# Patient Record
Sex: Male | Born: 1985
Health system: Southern US, Community
[De-identification: ages and names within clinical notes are randomized; demographics above are authoritative.]

## PROBLEM LIST (undated history)

## (undated) DIAGNOSIS — F419 Anxiety disorder, unspecified: Secondary | ICD-10-CM

## (undated) DIAGNOSIS — A6920 Lyme disease, unspecified: Secondary | ICD-10-CM

## (undated) HISTORY — DX: Anxiety disorder, unspecified: F41.9

## (undated) HISTORY — PX: INGUINAL HERNIA REPAIR: SUR1180

## (undated) HISTORY — DX: Lyme disease, unspecified: A69.20

---
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
## 2018-03-14 DIAGNOSIS — M778 Other enthesopathies, not elsewhere classified: Secondary | ICD-10-CM | POA: Diagnosis not present

## 2018-03-14 DIAGNOSIS — M25521 Pain in right elbow: Secondary | ICD-10-CM | POA: Diagnosis not present

## 2018-03-14 DIAGNOSIS — M7711 Lateral epicondylitis, right elbow: Secondary | ICD-10-CM | POA: Diagnosis not present

## 2018-03-26 DIAGNOSIS — H5213 Myopia, bilateral: Secondary | ICD-10-CM | POA: Diagnosis not present

## 2018-03-26 DIAGNOSIS — Z83511 Family history of glaucoma: Secondary | ICD-10-CM | POA: Diagnosis not present

## 2018-03-29 DIAGNOSIS — M7711 Lateral epicondylitis, right elbow: Secondary | ICD-10-CM | POA: Diagnosis not present

## 2018-03-30 DIAGNOSIS — M7711 Lateral epicondylitis, right elbow: Secondary | ICD-10-CM | POA: Insufficient documentation

## 2018-06-27 DIAGNOSIS — D229 Melanocytic nevi, unspecified: Secondary | ICD-10-CM | POA: Diagnosis not present

## 2018-06-27 DIAGNOSIS — D1721 Benign lipomatous neoplasm of skin and subcutaneous tissue of right arm: Secondary | ICD-10-CM | POA: Diagnosis not present

## 2018-06-27 DIAGNOSIS — L814 Other melanin hyperpigmentation: Secondary | ICD-10-CM | POA: Diagnosis not present

## 2018-06-27 DIAGNOSIS — L700 Acne vulgaris: Secondary | ICD-10-CM | POA: Diagnosis not present

## 2018-09-05 ENCOUNTER — Other Ambulatory Visit: Payer: Self-pay

## 2018-09-05 ENCOUNTER — Telehealth: Payer: Self-pay

## 2018-09-05 DIAGNOSIS — Z20822 Contact with and (suspected) exposure to covid-19: Secondary | ICD-10-CM

## 2018-09-05 NOTE — Telephone Encounter (Signed)
Patient called after message reviewed.  Wife of patient an RN with [employer], sx'ic this morning and getting tested for Covid. He is asx'ic, a Automotive engineer. Out today and next scheduled for Sunday. Currently needs to quarantine presently.  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   If his wife test returns + or his test returns + the health department will be notified and helpful in further contact tracing and potential return to work.  If his wife's test is negative and she is getting better, and his test returns negative and he remains asx'ic, likely can return to work, although Sunday may be a little early to have all the data points back for this return noted (as is Thursday 10:20 am presently). He understood.  He noted he has been working with a friend recently and should his friend go get tested. I recommended waiting on his friend being tested, and that friend may need to pending results of his wife's and his testing.   I noted to him he could call the health department if he preferred, and he agreed the above was best and will proceed with getting tested presently (at the [facility] drive thru facility). They will definitely need to be notified if any positives arise and he understood.

## 2018-09-05 NOTE — Telephone Encounter (Signed)
In the last 24 hours have you experienced any of these symptoms . Difficulty breathing - no . Nasal congestion - no  . New cough - no . Runny nose - no . Shortness of breath - yes trouble catching breath last couple days . New sore throat - no . Unexplained body aches - no . Nausea or vomiting - no . Diarrhea - no . Loss of taste or smell - no . Fever (temp>100 F/37.8 C) or chills - no  Exposure:  . Have you been in close contact with someone with confirmed diagnosis of COVID or person under going testing in past 14 days? Yes wife going today   . Have you been tested for COVID? If yes date, location, results in known no  . Have you been living in the same home as a person with confirmed diagnosis of COVID or a person undergoing testing? (household contact) yes wife being tested today   Have you been diagnosed with COVID or are you waiting on test results? Yes wife today  . Have you traveled anywhere in the past 4 weeks? If yes where - 08/03/18 went to [place] At home just pt and wife Off until Sunday  Wife started chills, nausea, fever, upset stomach started this morning. She went for testing this morning, she is a Therapist, sports at [employer]

## 2018-09-07 ENCOUNTER — Encounter: Payer: Self-pay | Admitting: Internal Medicine

## 2018-09-07 LAB — SPECIMEN STATUS REPORT

## 2018-09-07 LAB — NOVEL CORONAVIRUS, NAA: SARS-CoV-2, NAA: NOT DETECTED

## 2018-09-09 ENCOUNTER — Other Ambulatory Visit: Payer: Self-pay | Admitting: Internal Medicine

## 2018-09-09 MED ORDER — ESCITALOPRAM OXALATE 10 MG PO TABS: 10.0000 mg | ORAL_TABLET | Freq: Every day | ORAL | 0 refills | Status: DC

## 2018-09-09 NOTE — Telephone Encounter (Signed)
Pt states test came back neg. He wasn't feeling good over weekend, nausea and tired but feels fine now. Wife has been released to go back to work since test was neg. Not taking any meds. Not scheduled to go back to work until Wednesday.

## 2018-09-09 NOTE — Addendum Note (Signed)
Addended by: Virgilio Frees on: 09/09/2018 08:08 AM   Modules accepted: Orders

## 2018-09-09 NOTE — Telephone Encounter (Addendum)
Pt states he also needs his lexapro refilled, it is written for 1 a day but he has always took it 2 at night. He said maybe that's why he runs out so early. Said a previous MD in another state told him to take 2 at night before bed.

## 2018-09-09 NOTE — Progress Notes (Signed)
Called pt and made him aware he will come for an office visit on 09/16/18 to discuss meds and he would like to have labs drawn for lyme disease.

## 2018-09-09 NOTE — Progress Notes (Signed)
Patient called this morning and requested refill of his Lexapro. He noted he is now taking two tablets daily as was rec'ed to do so by an outside provider. The last refill through here was for one daily.   I also sent his Covid result through MyChart early Saturday so he would have this available to him for the weekend (was negative). He noted this morning on the call with Mearl Latin that his wife's Covid test returned negative as well. He was feeling nausea and fatigue this weekend, but feels much better today. His next scheduled work shift is Wed.   I will refill his Lexapro to take one daily with #60 given and ask that he schedule a follow-up here this week or next at the latest to review (and will have plenty to use twice daily in the interim until the f/u).  Also, a note to RTW was completed, and if he gets more sx'ic again, is to f/u.

## 2018-09-10 ENCOUNTER — Telehealth: Payer: Self-pay | Admitting: Internal Medicine

## 2018-09-10 MED ORDER — VALACYCLOVIR HCL 1 G PO TABS: ORAL_TABLET | ORAL | 3 refills | Status: DC

## 2018-09-10 NOTE — Telephone Encounter (Signed)
Reviewed message: "Yesterday around noon severe nausea felt he was going to pass out, then diarrhea after. Went to bed about 7:30pm, was up all night and this am diarrhea and woke up with a cold sore. He did take zofran this morning and last night he had at home. He checked his temp and it was 99.1 but he is sitting inside the [facility] and sweating.  He is having muscle pain and chills. He is getting worse since yesterday.  He states that he has had lyme's disease in the past and worries that he may have it again.   He is supposed to go back to work tomorrow"  Wife is a Producer, television/film/video, was in ICU 3 days prior to her not feeling well, and she was tested last week and was negative. Has no sx's now and back to work yesterday.  After noting some nausea and fatigue over the weekend, he was feeling great Monday morning until about noon, then felt nausea. No vomiting (as took Zofran to prevent), pounding HA, temp 100 right now, 99.1 this am. Not take tylenol yet. + cold sore. No marked cough (thinks is seasonal allergies), + diarrhea with many BM's overnight and this am, achy. In [facility] and trying to drink fluids. No loss of taste or smell.  He noted a tick bite weeks ago on his back and had a welt develop where bit, not a target rash when asked, (and that he has pulled off many ticks over the summer and has a h/o Lyme, on doxy to treat and had to see ID, get an ECG then) and he wondered about a test for Lyme again.   Discussed next steps, and will hold off on repeating the Covid test today but noted may need again in the next couple days pending his status (as did note Covid can present with GI sx's like he notes), not feel best to do any treatment empirically for Lyme and explained why and he agreed, as the med may worsen his GI sx's and also noted the blood testing (antibody test) may not be as helpful given his history of Lyme, rash not the classic bullseye by description.  More worried about a GI'itis possibility,  more viral than food induced and rec'ed increased fluids like doing, a pepto bismal product prn to help, and tylenol rather than any ibuprofen or aleve type product for HA and feverish feelings, rest with no work tomorrow, and monitor sx's. Note for work will be completed.   Noted valtrex can help with cold sore if started early in course and will prescribe this - 2 gm every 12 hours for two doses and he will pick up to use.   Will f/u by phone again at the latest on Thursday, and he has an appt scheduled with me on Monday to review his higher dose of Lexapro has been taking and await his status with above illness to confirm keeping this planned. Can f/u sooner prn.

## 2018-09-10 NOTE — Telephone Encounter (Signed)
Yesterday around noon severe nausea felt he was going to pass out, then diarrhea after. Went to bed about 7:30pm, was up all night and this am diarrhea and woke up with a cold sore. He did take zofran this morning and last night he had at home. He checked his temp and it was 99.1 but he is sitting inside the [facility] and sweating.  He is having muscle pain and chills. He is getting worse since yesterday.  He states that he has had lyme's disease in the past and worries that he may have it again.   He is supposed to go back to work tomorrow.

## 2018-09-10 NOTE — Telephone Encounter (Signed)
°  1. Do you have a fever? 99.1 2. Are you having chills? no 3. Do you have a sore throat? no 4. Are you experiencing resp S/Sx -cough, SOB? no 5. Do you have muscle aches? yes 6. Are you experiencing N/V/D?  n/d 7. Experiencing loss of sense of taste and/or smell? no 8. Do you have a headache? yes 9. Have you had contact with a person confirmed Positive for Covid-19? no 10. Have you traveled outside of [place]? 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  His s/m stared about 12pm yesterday. He thought he was over heated. He took a shower and laid down and feel asleep till around 7pm. He still felt bad he was back and forth to the bathroom with diarrhea all night. He also has a cold sore this morning and he said that is his indicator when he's really sick.

## 2018-09-12 NOTE — Telephone Encounter (Signed)
Patient called. Message reviewed.  Joseph Ochoa feels the same, really tired, no cough, no SOB, 100 this am temp,  3 loose BM's this am, settled down some, no blood. + body aches. Less nausea.  He still thinks he may have Lyme disease again.   Feel repeating the test for Covid is our best next step and he was ok with this. And will get again at the drive through.  Discussed Lyme with him again and he was treated in past for this in the past. Seems less likely but cannot exclude presently and limited some with testing for this due to his history noted as well. And empirically adding an abx not felt best presently.  Cont sx'ic measures and await f/u Covid test Not able to return to work Sat,hope by next scheduled shift early next week if feeling better.  F/u at latest again on Monday needed

## 2018-09-12 NOTE — Telephone Encounter (Signed)
Called pt he is very tired, still has headache, diarrhea has slowed down but not stopped. Took temp while on phone with me and it is 99.2.   Next day on shift is Saturday.

## 2018-09-13 ENCOUNTER — Other Ambulatory Visit: Payer: Self-pay

## 2018-09-13 DIAGNOSIS — Z20822 Contact with and (suspected) exposure to covid-19: Secondary | ICD-10-CM

## 2018-09-16 ENCOUNTER — Other Ambulatory Visit: Payer: Self-pay

## 2018-09-16 ENCOUNTER — Ambulatory Visit: Payer: Self-pay | Admitting: Internal Medicine

## 2018-09-16 DIAGNOSIS — Z20822 Contact with and (suspected) exposure to covid-19: Secondary | ICD-10-CM

## 2018-09-16 LAB — NOVEL CORONAVIRUS, NAA

## 2018-09-16 NOTE — Telephone Encounter (Signed)
Called patient, note reviewed from contacting him this am. Covid test on Friday was not run as insufficient sample noted. He remains sx'ic.  The immodium/pepto combination helped lessen diarrhea Friday, then stopped taking Sat as felt abdominal pains and felt more constipated, and then diarrhea increased again. Liquid, no blood, no black stools, in last 24 hours has gone about 10 times,  Sunday temp was 100.4, no fever today (99.3 earlier) although taking tylenol Min cough (he thinks seasonal), NP, no SOB, + body aches, very tired, no dysuria, no mucus with PND or cough, no bad sore throats, smell/taste ok. + N, no vomiting No new contacts of concern for Covid noted No recent abx, no travel in past weeks, (except [place] July 4th weekend) Just took some immodium now, used zofran this am, tylenol as well for temps.  I do think it would be best to repeat the Covid test and he will go today to get that done (drive thru like previous). Can use the imodium and pepto again in smaller quantities prn and cont tylenol prn and stay very well hydrated and very bland with any diet.  Not able to work presently (as await test results and him getting better sx'ically). Discussed not usually get stool cultures to send unless diarrhea more chronic without a concerning history of abx use or travel, but may need over time pending his response.   Will f/u with him by phone Wed at the latest, sooner prn.

## 2018-09-16 NOTE — Telephone Encounter (Signed)
Joseph Ochoa called & said he received a MyChart alert that his COVID test from Friday (8/14) results are inconclusive & the test was canceled because not enough sample was obtained. States he's having the following symptoms: 1.  Temp of 100.4 yesterday, but none so far today.  He did take some Tylenol earlier this AM. 2.  Still having diarrhea & it's waking him up multiple times at night.  Said he stopped taking the Immodium/Pepto Bismol around noon on Saturday because it was causing severe abd pain.  Late Saturday night, diarrhea started back.  Said he's gone 6x times this morning so far (@ 10:42 am). 3.  Feels nauseate - no vomiting.  States he's been taking Zofran where he had a Rx from a previous sickness.  States he's drinking non-stop water & gatorade. 4.  "Super super tired" - sleeping a lot. 5.  Been in air conditioning, but profusely sweating. Advised him not to come at 1:15pm today & that Dr. Roxan Hockey will contact him.  Gergory questioned whether he needs to go back today for another COVID test.  AMD

## 2018-09-17 NOTE — Telephone Encounter (Signed)
Follow up on Wednesday with pt.

## 2018-09-18 LAB — NOVEL CORONAVIRUS, NAA: SARS-CoV-2, NAA: NOT DETECTED

## 2018-09-18 NOTE — Telephone Encounter (Signed)
Left message on machine.

## 2018-09-18 NOTE — Telephone Encounter (Signed)
Left message for pt to call back  °

## 2018-09-18 NOTE — Telephone Encounter (Signed)
Pt states that he feels better, still has diarrhea and is very tired. No fever and additional covid test is NEG. He is scheduled to work back again on Friday.

## 2018-09-18 NOTE — Telephone Encounter (Signed)
As per our discussion, glad is feeling somewhat better. Two tests for Covid now negative. Still notes fatigued and still some diarrhea. Had previously, stopped sx'ic meds and then more constipated feelings and then diarrhea sx's recurred. No abx prior, no travel of concern. He had Lyme prior and was concerned for this possibility, and noted testing can be challenging if had prior (antibody can remain positive, including IgM Ab), and not best to empirically prescribe an abx with his sx's noted. He noted tick exposures but not the classic rash can see with Lyme.  Feel best approach is see how feels today and tomorrow and is scheduled to return to work Friday. If sx's cont to improve, ok to RTW as feel not a Covid + concern. If not and sx's are more problematic, recommend a f/u visit to be assessed, and likely more comprehensive testing at that point.

## 2018-09-19 DIAGNOSIS — L918 Other hypertrophic disorders of the skin: Secondary | ICD-10-CM | POA: Diagnosis not present

## 2018-09-19 DIAGNOSIS — D229 Melanocytic nevi, unspecified: Secondary | ICD-10-CM | POA: Diagnosis not present

## 2018-09-19 DIAGNOSIS — L700 Acne vulgaris: Secondary | ICD-10-CM | POA: Diagnosis not present

## 2018-09-19 DIAGNOSIS — L814 Other melanin hyperpigmentation: Secondary | ICD-10-CM | POA: Diagnosis not present

## 2018-09-19 NOTE — Telephone Encounter (Signed)
Spoke with pt and he will stay out of work Friday and try to return on Monday. He did however make an appt to come in and see a provider on Tuesday since he would like to discuss his concern of Lyme Disease.

## 2018-09-20 NOTE — Telephone Encounter (Signed)
Joseph Ochoa needs a return to work notice. He goes back on Monday the 24th. It needs to go [name].

## 2018-09-20 NOTE — Telephone Encounter (Signed)
Note faxed to supervisor

## 2018-09-24 ENCOUNTER — Other Ambulatory Visit: Payer: Self-pay

## 2018-09-24 ENCOUNTER — Encounter: Payer: Self-pay | Admitting: Physician Assistant

## 2018-09-24 ENCOUNTER — Ambulatory Visit: Payer: 59 | Admitting: Physician Assistant

## 2018-09-24 VITALS — BP 126/86 | HR 69 | Temp 98.0°F | Resp 14 | Ht 69.0 in | Wt 213.0 lb

## 2018-09-24 DIAGNOSIS — Z87898 Personal history of other specified conditions: Secondary | ICD-10-CM

## 2018-09-24 NOTE — Progress Notes (Signed)
33 yo WM followed recently for concerns about possible Covid. Wife is a Marine scientist an recently reported negative after possible exposure.  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 Treated symptomatically and symptoms have now resolved. He feels generally well  except tires more easily than usual. Had a distant hx experience with Lymes disease years ago in [place] and that possibility was concerning to him. Feeling overall much better now. We discussed situational stressors with CV issues, job adjustments , family concerns as well as increase Fall allergy symptoms in the area. Also recognizes long standing issues with anxiety but denies concern at this time.    Phys Exam VS reviewed WNL Alert interactive" feels fine" Skin well hydrated Eyes clear Breathing Quiet regular Lungs clear ausc Heart RSR Abd soft, nontender, normal BS Ambulatory  A; Diarhea resolved  P: Plan to follow up if recurance,questions or concerns  Has returned to work and is doing well. Symptoms could have been a simple viral infection or other expossure  Did not have seasonal allergies in [place] , but advised can develop later in life  and in new environment-reviewed common Fall  Irritants locally.

## 2018-09-24 NOTE — Patient Instructions (Signed)
Lyme Disease Lyme disease is an infection that can affect many parts of the body, including the skin, joints, and nervous system. It is a bacterial infection that starts from the bite of an infected tick. Over time, the infection can worsen, and some of the symptoms are similar to the flu. If Lyme disease is not treated, it may cause joint pain, swelling, numbness, problems thinking, fatigue, muscle weakness, and other problems. What are the causes? This condition is caused by bacteria called Borrelia burgdorferi.  You can get Lyme disease by being bitten by an infected tick.  Only black-legged, or Ixodes, ticks that are infected with the bacteria can cause Lyme disease.  The tick must be attached to your skin for a certain period of time to pass along the infection. This is usually 36-48 hours.  Deer often carry infected ticks. What increases the risk? The following factors may make you more likely to develop this condition:  Living in or visiting these areas in the [place].: ? [place]. ? The mid[place] states. ? The [place].  Spending time in wooded or grassy areas.  Being outdoors with exposed skin.  Camping, gardening, hiking, fishing, hunting, or working outdoors.  Failing to remove a tick from your skin. What are the signs or symptoms? Symptoms of this condition may include:  Chills and fever.  Headache.  Fatigue.  General achiness.  Muscle pain.  Joint pain, often in the knees.  A round, red rash that surrounds the center of the tick bite. The center of the rash may be blood colored or have tiny blisters.  Swollen lymph glands.  Stiff neck. How is this diagnosed? This condition is diagnosed based on:  Your symptoms and medical history.  A physical exam.  A blood test. How is this treated? The main treatment for this condition is antibiotic medicine, which is usually taken by mouth (orally).  The length of treatment depends on how soon after a  tick bite you begin taking the medicine. In some cases, treatment is necessary for several weeks.  If the infection is severe, antibiotics may need to be given through an IV that is inserted into one of your veins. Follow these instructions at home:  Take over-the-counter and prescription medicines only as told by your health care provider. Finish all antibiotic medicine, even when you start to feel better.  Ask your health care provider about taking a probiotic in between doses of your antibiotic to help avoid an upset stomach or diarrhea.  Check with your health care provider before supplementing your treatment. Many alternative therapies have not been proven and may be harmful to you.  Keep all follow-up visits as told by your health care provider. This is important. How is this prevented? You can become reinfected if you get another tick bite from an infected tick. Take these steps to help prevent an infection:  Cover your skin with light-colored clothing when you are outdoors in the spring and summer months.  Spray clothing and skin with bug spray. The spray should be 20-30% DEET. You can also treat clothing with permethrin, and let it dry before you wear it. Do not apply permethrin directly to your skin. Permethrin can also be used to treat camping gear and boots. Always read and follow the instructions that come with a bug spray or insecticide.  Avoid wooded, grassy, and shaded areas.  Remove yard litter, brush, trash, and plants that attract deer and rodents.  Check yourself for ticks when   you come indoors.  Wash clothing worn each day.  Shower after spending time outdoors.  Check your pets for ticks before they come inside.  If you find a tick attached to your skin: ? Remove it with tweezers. ? Clean your hands and the bite area with rubbing alcohol or soap and water. ? Dispose of the tick by putting it in rubbing alcohol, putting it in a sealed bag or container, or  flushing it down the toilet. ? You may choose to save the tick in a sealed container if you wish for it to be tested at a later time. Pregnant women should take special care to avoid tick bites because it is possible that the infection may be passed along to the fetus. Contact a health care provider if:  You have symptoms after treatment.  You have removed a tick and want to bring it to your health care provider for testing. Get help right away if:  You have an irregular heartbeat.  You have chest pain.  You have nerve pain.  Your face feels numb.  You develop the following: ? A stiff neck. ? A severe headache. ? Severe nausea and vomiting. ? Sensitivity to light. Summary  Lyme disease is an infection that can affect many parts of the body, including the skin, joints, and nervous system.  This condition is caused by bacteria called Borrelia burgdorferi.  You can get Lyme disease by being bitten by an infected tick.  The main treatment for this condition is antibiotic medicine. This information is not intended to replace advice given to you by your health care provider. Make sure you discuss any questions you have with your health care provider. Document Released: 04/24/2000 Document Revised: 05/10/2018 Document Reviewed: 04/04/2018 Elsevier Patient Education  2020 Elsevier Inc.  

## 2018-10-22 ENCOUNTER — Other Ambulatory Visit: Payer: Self-pay

## 2018-10-22 ENCOUNTER — Ambulatory Visit: Payer: Self-pay | Admitting: Internal Medicine

## 2018-10-22 ENCOUNTER — Encounter: Payer: Self-pay | Admitting: Internal Medicine

## 2018-10-22 VITALS — BP 130/87 | HR 66 | Temp 98.3°F | Resp 12 | Ht 69.0 in | Wt 212.0 lb

## 2018-10-22 DIAGNOSIS — F411 Generalized anxiety disorder: Secondary | ICD-10-CM

## 2018-10-22 MED ORDER — CLONAZEPAM 0.5 MG PO TABS: ORAL_TABLET | ORAL | 0 refills | Status: DC

## 2018-10-22 NOTE — Progress Notes (Signed)
"  Super, super stressed out & having severe anxiety & panic attacks".  Heart racing and hand shaking. Just returned from [place] yesterday - death of grandfather.  Grandparents raised him & grandfather like a father to him.  Hx of anxiety - takes Environmental health practitioner - On vacation until 10/31/2018.  Took off 3 wks - Sister got married.   Phone call on 9th that grandfather thought to of had a stroke & then he passed away on the Apr 15, 2022.  AMD

## 2018-10-22 NOTE — Progress Notes (Signed)
S - Nursing note reviewed. Patient is a 33 y.o. male with h/o anxiety on lexapro who was in [place], just returned yesterday for a funeral for his GF (who was like a father to him). He notes he has been very stressed, goes through panic episodes and his hands shake, his heart races, and has been difficult to calm down when occurs.  He noted in the past he was on Xanax at one point and did not like how he felt on that (more tired) and so he stopped.  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  He denied any N/V, any marked depression sx's. He has not seen counseling here prior.   No Known Allergies Current Outpatient Medications on File Prior to Visit  Medication Sig Dispense Refill  . escitalopram (LEXAPRO) 10 MG tablet Take 1 tablet (10 mg total) by mouth daily. 60 tablet 0  . valACYclovir (VALTREX) 1000 MG tablet Take two tabs every 12 hours for a total of two doses 8 tablet 3   No current facility-administered medications on file prior to visit.    Never smoker  O- NAD, masked  BP 130/87 (BP Location: Right Arm, Patient Position: Bed low/side rails up, Cuff Size: Large)   Pulse 66   Temp 98.3 F (36.8 C) (Oral)   Resp 12   Ht 5\' 9"  (1.753 m)   Wt 212 lb (96.2 kg)   SpO2 99%   BMI 31.31 kg/m   Sclera anicteric Car - RRR without m/g/r, not tachy Pulm - CTA ABd - soft, NT Ext -no LE edema Affect was not flat, approp with conversation, speech not rapid  Ass/Plan - 1. Acute anxiety state with panic attack episodes  2. Generalized anxiety disorder hx - on Lexapro  Educated on above  Counseling referral and Lelon Frohlich called to [facility] and information given and patient will call there (card given) to set up appt. Discussed how counseling and meds together work better than either alone. He agreed to see counseling to help. Cont the lexapro  Will add clonazepam - 0.5 mg daily at bedtime to start prn and warned  may make drowsy. Educated on medicine and is in same class as Xanax he was on prior. If having acute panic during the day, he can take a dose of clonazepam as well and not to drive after taking rec'ed Not to take more than two daily)  He is to return to work Friday, Oct 2, and will set up a f/u appt next week before he is scheduled to return to assess if best for him to return to a 24 hour shift like is scheduled.   Can f/u sooner prn as well.

## 2018-10-30 ENCOUNTER — Ambulatory Visit: Payer: Self-pay | Admitting: Internal Medicine

## 2018-10-30 ENCOUNTER — Other Ambulatory Visit: Payer: Self-pay

## 2018-10-30 ENCOUNTER — Encounter: Payer: Self-pay | Admitting: Internal Medicine

## 2018-10-30 VITALS — BP 129/86 | HR 68 | Temp 97.9°F

## 2018-10-30 DIAGNOSIS — F411 Generalized anxiety disorder: Secondary | ICD-10-CM

## 2018-10-30 MED ORDER — VALACYCLOVIR HCL 1 G PO TABS: ORAL_TABLET | ORAL | 1 refills | Status: DC

## 2018-10-30 NOTE — Progress Notes (Signed)
S - I went and roomed the patient after he waited 15 minutes past his appt in the waiting room and Lelon Frohlich still doing testing with another patient.   Patient is a 33 y.o. male with h/o anxiety on lexapro who was in [place], just returned the day before his visit on 10/22/2018 (in [place] for a funeral for his GF who was like a father to him). He noted at that visit he was very stressed, having panic episodes, hands shake, heart racing, and was difficult to calm down when occurred. Counseling was consulted ([facility]) and Klonopin 0.5 mg at bedtime started and noted he could use during day if having acute panic episodes. (He noted in the past he was on Xanax at one point and did not like how he felt on that (more tired) and so he stopped). Reviewed past note.  Over the past week, he has seen counseling once and used the klonopin only at nighttime and notes he feels much better at present. He has tried to stay active in the day time and has support at his home with his wife. He is sleeping ok and denied any crying spells or feeling more depressed.  He noted he does not like to take medicines and tries not to use the klonopin when not felt needed.   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 He feels like he is more than ready and able to return to work, and he talked to his supervisor and noted that if he is developing any increased panic and/or symptoms of concern when at work, he can leave work and I felt that was best (as not like him to remain at work if he feels like he needs to take the Klonopin med)  No Known Allergies Meds - lexapro and Klonopin prn as above  Never smoker  O- NAD, masked, looks well, not anxious appearing  BP - 129/86, P - 68, T - 97.9   BP last visit - 130/87  Sclera anicteric Car - RRR without m/g/r, not tachy Pulm - CTA Ext -no LE edema Affect was not flat, approp with conversation, speech not  rapid  Ass/Plan - 1. Acute anxiety state with panic attack episodes - noted much improved at present             2. Generalized anxiety disorder hx - on Lexapro  Cont the lexapro  Counseling to continue with [facility]. Discussed how counseling and meds together work better than either alone.  Can cont the clonazepam - 0.5 mg daily at bedtime and If having acute panic during the day, he can take a dose of clonazepam as well and not to drive after taking rec'ed. Also as above noted, if feels like needs while at work, best to leave work given the stresses of his job as a Airline pilot and he noted he can do so.    Ok to return to work Friday, Oct 2, and to f/u again in 3-4 weeks. Can f/u sooner prn as well.

## 2018-11-06 ENCOUNTER — Encounter: Payer: Self-pay | Admitting: Registered Nurse

## 2018-11-06 ENCOUNTER — Telehealth: Payer: Self-pay | Admitting: Registered Nurse

## 2018-11-06 MED ORDER — ESCITALOPRAM OXALATE 10 MG PO TABS: 10.0000 mg | ORAL_TABLET | Freq: Every day | ORAL | 0 refills | Status: DC

## 2018-11-06 NOTE — Telephone Encounter (Signed)
Last seen by Dr Roxan Hockey 2 Oct and xanax use tapered continue lexapro and scheduled follow up 28 Oct.  Bridge refill electronic Rx escitalopram 10mg  po daily #60 RF0 sent to his pharmacy of choice.  Patient has been on escitalopram since at least 2019 per his paper chart at [facility] clinic.

## 2018-11-13 ENCOUNTER — Other Ambulatory Visit: Payer: Self-pay

## 2018-11-13 DIAGNOSIS — Z0289 Encounter for other administrative examinations: Secondary | ICD-10-CM

## 2018-11-13 LAB — POCT URINALYSIS DIPSTICK
Bilirubin, UA: NEGATIVE
Blood, UA: NEGATIVE
Glucose, UA: NEGATIVE
Ketones, UA: NEGATIVE
Leukocytes, UA: NEGATIVE
Nitrite, UA: NEGATIVE
Protein, UA: NEGATIVE
Spec Grav, UA: 1.01 (ref 1.010–1.025)
Urobilinogen, UA: 0.2 E.U./dL
pH, UA: 5 (ref 5.0–8.0)

## 2018-11-25 ENCOUNTER — Other Ambulatory Visit: Payer: Self-pay

## 2018-11-25 ENCOUNTER — Encounter: Payer: Self-pay | Admitting: Occupational Medicine

## 2018-11-25 ENCOUNTER — Ambulatory Visit: Payer: Self-pay | Admitting: Occupational Medicine

## 2018-11-25 VITALS — BP 116/70 | HR 56 | Temp 98.3°F | Resp 12 | Ht 69.0 in | Wt 208.0 lb

## 2018-11-25 DIAGNOSIS — Z0000 Encounter for general adult medical examination without abnormal findings: Secondary | ICD-10-CM

## 2018-12-03 ENCOUNTER — Ambulatory Visit: Payer: Self-pay

## 2018-12-06 ENCOUNTER — Ambulatory Visit (INDEPENDENT_AMBULATORY_CARE_PROVIDER_SITE_OTHER): Payer: 59 | Admitting: Family Medicine

## 2018-12-06 ENCOUNTER — Other Ambulatory Visit: Payer: Self-pay

## 2018-12-06 ENCOUNTER — Encounter: Payer: Self-pay | Admitting: Family Medicine

## 2018-12-06 VITALS — BP 151/79 | HR 62 | Temp 98.2°F | Ht 69.7 in | Wt 201.4 lb

## 2018-12-06 DIAGNOSIS — F411 Generalized anxiety disorder: Secondary | ICD-10-CM

## 2018-12-06 DIAGNOSIS — R69 Illness, unspecified: Secondary | ICD-10-CM | POA: Diagnosis not present

## 2018-12-06 DIAGNOSIS — R7989 Other specified abnormal findings of blood chemistry: Secondary | ICD-10-CM

## 2018-12-06 DIAGNOSIS — R5383 Other fatigue: Secondary | ICD-10-CM

## 2018-12-06 DIAGNOSIS — Z7689 Persons encountering health services in other specified circumstances: Secondary | ICD-10-CM

## 2018-12-06 DIAGNOSIS — Z8719 Personal history of other diseases of the digestive system: Secondary | ICD-10-CM

## 2018-12-06 DIAGNOSIS — N529 Male erectile dysfunction, unspecified: Secondary | ICD-10-CM | POA: Diagnosis not present

## 2018-12-06 DIAGNOSIS — R1032 Left lower quadrant pain: Secondary | ICD-10-CM | POA: Diagnosis not present

## 2018-12-06 DIAGNOSIS — Z8042 Family history of malignant neoplasm of prostate: Secondary | ICD-10-CM

## 2018-12-06 NOTE — Progress Notes (Signed)
BP (!) 151/79   Pulse 62   Temp 98.2 F (36.8 C) (Oral)   Ht 5' 9.7" (1.77 m)   Wt 201 lb 6.4 oz (91.4 kg)   SpO2 97%   BMI 29.15 kg/m    Subjective:    Patient ID: Joseph Ochoa, male    DOB: Oct 22, 1985, 33 y.o.   MRN: [id number]  HPI: Joseph Ochoa is a 33 y.o. male  Chief Complaint  Patient presents with  . Establish Care   Patient presenting today to establish care.   Recently had a biometric screening exam for work and had some labs come back abnormal, including elevated LFTs and a mildly elevated serum creatinine. He and his wife are concerned about these levels. He states he drinks only socially, not even once a week, and does not take tylenol or any liver irritating medications/supplements. No abdominal pain, fevers, jaundice.   Hx of anxiety for which he is taking lexapro with good control. Has been on it for about 4-5 years now. Was given klonopin recently by past provider but didn't really want to take that. Feels the lexapro is enough currently. Denies side effects, SI/HI, frequent panic episodes, mood concerns.   Very fatigued the past few months, though states he is sleeping well, eating a healthy diet, exercising, and not having mood concerns. Also having problems getting an erection the past couple of months as well which has happened occasionally in the past but not this consistently. Not having any urinary sxs, relationship concerns, or new stressors. Concerned about his testosterone levels and also states he has a strong fhx of early onset prostate cancer so would like to get that checked as well.   left inguinal hernia surgery in 2016, having sharp pain daily for about 1.5 years in that same area that is worse with certain movements. Has not felt a bulge or noticed any skin changes. Not trying anything currently for these sxs, just dealing with them.   Relevant past medical, surgical, family and social history reviewed and updated as indicated. Interim medical  history since our last visit reviewed. Allergies and medications reviewed and updated.  Review of Systems  Per HPI unless specifically indicated above     Objective:    BP (!) 151/79   Pulse 62   Temp 98.2 F (36.8 C) (Oral)   Ht 5' 9.7" (1.77 m)   Wt 201 lb 6.4 oz (91.4 kg)   SpO2 97%   BMI 29.15 kg/m   Wt Readings from Last 3 Encounters:  12/12/18 202 lb (91.6 kg)  12/06/18 201 lb 6.4 oz (91.4 kg)  11/25/18 208 lb (94.3 kg)    Physical Exam Vitals signs and nursing note reviewed.  Constitutional:      Appearance: Normal appearance.  HENT:     Head: Atraumatic.  Eyes:     Extraocular Movements: Extraocular movements intact.     Conjunctiva/sclera: Conjunctivae normal.  Neck:     Musculoskeletal: Normal range of motion and neck supple.  Cardiovascular:     Rate and Rhythm: Normal rate and regular rhythm.  Pulmonary:     Effort: Pulmonary effort is normal.     Breath sounds: Normal breath sounds.  Abdominal:     General: Bowel sounds are normal.     Palpations: Abdomen is soft.     Tenderness: There is no abdominal tenderness. There is no right CVA tenderness, left CVA tenderness or guarding.  Genitourinary:    Comments: No scrotal swelling or testicular  ttp associated with his left groin pain Musculoskeletal: Normal range of motion.     Comments: ttp left groin area near previous inguinal hernia repair incision. No palpable defect/bulge, redness, swelling  Skin:    General: Skin is warm and dry.  Neurological:     General: No focal deficit present.     Mental Status: He is oriented to person, place, and time.  Psychiatric:        Mood and Affect: Mood normal.        Thought Content: Thought content normal.        Judgment: Judgment normal.     Results for orders placed or performed in visit on 12/06/18  Comprehensive metabolic panel  Result Value Ref Range   Glucose 97 65 - 99 mg/dL   BUN 15 6 - 20 mg/dL   Creatinine, Ser 1.07 0.76 - 1.27 mg/dL   GFR  calc non Af Amer 92 >59 mL/min/1.73   GFR calc Af Amer 106 >59 mL/min/1.73   BUN/Creatinine Ratio 14 9 - 20   Sodium 143 134 - 144 mmol/L   Potassium 4.2 3.5 - 5.2 mmol/L   Chloride 102 96 - 106 mmol/L   CO2 25 20 - 29 mmol/L   Calcium 9.8 8.7 - 10.2 mg/dL   Total Protein 7.3 6.0 - 8.5 g/dL   Albumin 4.9 4.0 - 5.0 g/dL   Globulin, Total 2.4 1.5 - 4.5 g/dL   Albumin/Globulin Ratio 2.0 1.2 - 2.2   Bilirubin Total 1.6 (H) 0.0 - 1.2 mg/dL   Alkaline Phosphatase 59 39 - 117 IU/L   AST 26 0 - 40 IU/L   ALT 20 0 - 44 IU/L  TSH  Result Value Ref Range   TSH 1.990 0.450 - 4.500 uIU/mL  Vitamin B12  Result Value Ref Range   Vitamin B-12 544 232 - 1,245 pg/mL  Hepatitis panel, acute  Result Value Ref Range   Hep A IgM Negative Negative   Hepatitis B Surface Ag Negative Negative   Hep B C IgM Negative Negative   Hep C Virus Ab 0.1 0.0 - 0.9 s/co ratio  PSA  Result Value Ref Range   Prostate Specific Ag, Serum 0.7 0.0 - 4.0 ng/mL  Vitamin D (25 hydroxy)  Result Value Ref Range   Vit D, 25-Hydroxy 42.0 30.0 - 100.0 ng/mL  Testosterone, free, total  Result Value Ref Range   Testosterone 623 264 - 916 ng/dL   Testosterone, Free 10.9 8.7 - 25.1 pg/mL   Sex Hormone Binding 49.2 16.5 - 55.9 nmol/L      Assessment & Plan:   Problem List Items Addressed This Visit      Other   Generalized anxiety disorder    Stable and under good control, continue lexapro regimen       Other Visit Diagnoses    Left groin pain    -  Primary   Referral placed to gen surgery for further eval given duration of pain and hx of hernia repair surgery   Relevant Orders   Ambulatory referral to General Surgery   Encounter to establish care       Family history of prostate cancer       Will check PSA per patient request given fhx   Relevant Orders   PSA (Completed)   Elevated LFTs       Recheck labs today, avoid alcohol and tylenol, hepatitis panel pending. F/u based on repeat results   Relevant Orders  Comprehensive metabolic panel (Completed)   Hepatitis panel, acute (Completed)   Fatigue, unspecified type       Will check vitamin levels, tsh. Continue to monitor   Relevant Orders   TSH (Completed)   Vitamin B12 (Completed)   Vitamin D (25 hydroxy) (Completed)   Testosterone, free, total (Completed)   Erectile dysfunction, unspecified erectile dysfunction type       Will check testosterone levels. Suspect more mood related than organic cause. Will consider a prn medication if persisting and labs normal   Relevant Orders   Testosterone, free, total (Completed)   History of inguinal hernia       Relevant Orders   Ambulatory referral to General Surgery       Follow up plan: Return for CPE.

## 2018-12-08 LAB — HEPATITIS PANEL, ACUTE
Hep A IgM: NEGATIVE
Hep B C IgM: NEGATIVE
Hep C Virus Ab: 0.1 {s_co_ratio} (ref 0.0–0.9)
Hepatitis B Surface Ag: NEGATIVE

## 2018-12-08 LAB — COMPREHENSIVE METABOLIC PANEL WITH GFR
ALT: 20 IU/L (ref 0–44)
AST: 26 IU/L (ref 0–40)
Albumin/Globulin Ratio: 2 (ref 1.2–2.2)
Albumin: 4.9 g/dL (ref 4.0–5.0)
Alkaline Phosphatase: 59 IU/L (ref 39–117)
BUN/Creatinine Ratio: 14 (ref 9–20)
BUN: 15 mg/dL (ref 6–20)
Bilirubin Total: 1.6 mg/dL — ABNORMAL HIGH (ref 0.0–1.2)
CO2: 25 mmol/L (ref 20–29)
Calcium: 9.8 mg/dL (ref 8.7–10.2)
Chloride: 102 mmol/L (ref 96–106)
Creatinine, Ser: 1.07 mg/dL (ref 0.76–1.27)
GFR calc Af Amer: 106 mL/min/1.73 (ref >59)
GFR calc non Af Amer: 92 mL/min/1.73 (ref >59)
Globulin, Total: 2.4 g/dL (ref 1.5–4.5)
Glucose: 97 mg/dL (ref 65–99)
Potassium: 4.2 mmol/L (ref 3.5–5.2)
Sodium: 143 mmol/L (ref 134–144)
Total Protein: 7.3 g/dL (ref 6.0–8.5)

## 2018-12-08 LAB — TESTOSTERONE, FREE, TOTAL, SHBG
Sex Hormone Binding: 49.2 nmol/L (ref 16.5–55.9)
Testosterone, Free: 10.9 pg/mL (ref 8.7–25.1)
Testosterone: 623 ng/dL (ref 264–916)

## 2018-12-08 LAB — VITAMIN B12: Vitamin B-12: 544 pg/mL (ref 232–1245)

## 2018-12-08 LAB — TSH: TSH: 1.99 u[IU]/mL (ref 0.450–4.500)

## 2018-12-08 LAB — PSA: Prostate Specific Ag, Serum: 0.7 ng/mL (ref 0.0–4.0)

## 2018-12-08 LAB — VITAMIN D 25 HYDROXY (VIT D DEFICIENCY, FRACTURES): Vit D, 25-Hydroxy: 42 ng/mL (ref 30.0–100.0)

## 2018-12-12 ENCOUNTER — Other Ambulatory Visit: Payer: 59

## 2018-12-12 ENCOUNTER — Encounter: Payer: Self-pay | Admitting: General Surgery

## 2018-12-12 ENCOUNTER — Ambulatory Visit: Payer: 59 | Admitting: Surgery

## 2018-12-12 ENCOUNTER — Other Ambulatory Visit: Payer: Self-pay

## 2018-12-12 ENCOUNTER — Ambulatory Visit (INDEPENDENT_AMBULATORY_CARE_PROVIDER_SITE_OTHER): Payer: 59 | Admitting: General Surgery

## 2018-12-12 VITALS — BP 147/83 | HR 60 | Temp 98.1°F | Ht 69.0 in | Wt 202.0 lb

## 2018-12-12 DIAGNOSIS — Z8719 Personal history of other diseases of the digestive system: Secondary | ICD-10-CM | POA: Diagnosis not present

## 2018-12-12 DIAGNOSIS — Z9889 Other specified postprocedural states: Secondary | ICD-10-CM

## 2018-12-12 DIAGNOSIS — R1032 Left lower quadrant pain: Secondary | ICD-10-CM | POA: Diagnosis not present

## 2018-12-12 NOTE — Progress Notes (Signed)
Patient ID: Joseph Ochoa, Joseph Ochoa   DOB: Jun 04, 1985, 33 y.o.   MRN: [id number]  Chief Complaint  Patient presents with  . New Patient (Initial Visit)    left groin pain    HPI Joseph Ochoa is a 33 y.o. Joseph Ochoa. He was referred by his primary care provider, Merrie Roof, PA-C, for evaluation of left groin pain.  Joseph Ochoa states that he had a laparoscopic left inguinal hernia repair in 2016.  This was performed in [place].  Recently, he has noticed some pain in the area.  It is sharp and radiates from his navel to his left testicle.  He says that it aches more when he is lying down, rather than with activity.  He feels like there is a lump in the area that is always present.  He denies any nausea or vomiting.  No fevers or chills.  He does not have any difficulty with urination or having bowel movements.  He denies any pain radiating down his leg.   Past Medical History:  Diagnosis Date  . Anxiety   . Lyme disease     Past Surgical History:  Procedure Laterality Date  . INGUINAL HERNIA REPAIR Left   . LYMPHANGIOMA EXCISION Right 2001   on right shoulder    Family History  Problem Relation Age of Onset  . Hyperlipidemia Mother   . Epilepsy Mother   . Hypertension Father   . Prostate cancer Father   . Diabetes Maternal Grandfather   . Glaucoma Maternal Grandfather   . Arthritis Maternal Grandfather   . Hypertension Maternal Grandfather   . Hypertension Paternal Grandfather   . Prostate cancer Paternal Grandfather     Social History Social History   Tobacco Use  . Smoking status: Former Smoker    Quit date: 06/30/2016    Years since quitting: 2.4  . Smokeless tobacco: Never Used  Substance Use Topics  . Alcohol use: Yes    Comment: socially  . Drug use: Never    No Known Allergies  Current Outpatient Medications  Medication Sig Dispense Refill  . escitalopram (LEXAPRO) 10 MG tablet Take 1 tablet (10 mg total) by mouth daily. 60 tablet 0  . valACYclovir (VALTREX)  1000 MG tablet Take two tabs every 12 hours for a total of two doses 8 tablet 1   No current facility-administered medications for this visit.     Review of Systems Review of Systems  All other systems reviewed and are negative.   Blood pressure (!) 147/83, pulse 60, temperature 98.1 F (36.7 C), height 5\' 9"  (1.753 m), weight 202 lb (91.6 kg), SpO2 96 %.  Physical Exam Physical Exam Vitals signs reviewed. Exam conducted with a chaperone present.  Constitutional:      General: He is not in acute distress.    Appearance: Normal appearance.  HENT:     Head: Normocephalic and atraumatic.     Nose: Nose normal.     Mouth/Throat:     Comments: Covered with a mask secondary to COVID-19 precautions Eyes:     General: No scleral icterus.       Right eye: No discharge.        Left eye: No discharge.     Conjunctiva/sclera: Conjunctivae normal.  Neck:     Musculoskeletal: No neck rigidity.     Comments: No thyromegaly or dominant thyroid masses appreciated Cardiovascular:     Rate and Rhythm: Normal rate and regular rhythm.     Pulses: Normal pulses.  Pulmonary:     Effort: Pulmonary effort is normal.     Breath sounds: Normal breath sounds.  Abdominal:     General: Abdomen is flat.     Palpations: Abdomen is soft.     Hernia: No hernia is present. There is no hernia in the left inguinal area or right inguinal area.  Genitourinary:    Comments: There is some minor thickening in the left inguinal area, but I do not appreciate a bulge.  No mass identified, nor any fascial defect, even with Valsalva. Musculoskeletal:     Right lower leg: No edema.     Left lower leg: No edema.  Lymphadenopathy:     Cervical: No cervical adenopathy.  Skin:    General: Skin is warm and dry.  Neurological:     General: No focal deficit present.     Mental Status: He is alert and oriented to person, place, and time.  Psychiatric:        Mood and Affect: Mood normal.        Behavior: Behavior  normal.     Data Reviewed There are no relevant data available for review.  Assessment This is a 33 year old Joseph Ochoa who has a history of prior laparoscopic left inguinal hernia repair.  He is experiencing pain in the area and feels like there is a physical bulge.  On my exam, I do not appreciate a hernia.  His symptoms are worse with recumbency, rather than activity.  He may be experiencing some complications related to his prior mesh placement.  Plan We will obtain a CT scan of the pelvis to better evaluate the area.  Certainly, if there is a small hernia that I was not able to appreciate on exam today, the scan should reveal this.  If 1 is present, we can certainly repair it.  Otherwise, the scan may give [place] additional information about what might be causing his discomfort.  I will contact the patient when I have these results and proceed as indicated.    Fredirick Maudlin 12/12/2018, 10:24 AM

## 2018-12-12 NOTE — Patient Instructions (Addendum)
Patient has been scheduled for a CT pelvis with contrast on 12/18/18 at [facility] for 10:00 am, please arrive by 9:45 am. Prep: Nothing by mouth 4 hours prior. You will need to pick up a prep kit.   [facility] is located just across the street from [facility] in [place]. [address], [address].  We will call you with the results.

## 2018-12-15 NOTE — Assessment & Plan Note (Signed)
Stable and under good control, continue lexapro regimen

## 2018-12-18 ENCOUNTER — Other Ambulatory Visit: Payer: Self-pay

## 2018-12-18 ENCOUNTER — Ambulatory Visit: Payer: Self-pay

## 2018-12-18 ENCOUNTER — Ambulatory Visit: Admission: RE | Admit: 2018-12-18 | Payer: 59 | Source: Ambulatory Visit

## 2019-03-04 ENCOUNTER — Ambulatory Visit: Payer: Self-pay | Attending: Internal Medicine

## 2019-03-04 DIAGNOSIS — Z20822 Contact with and (suspected) exposure to covid-19: Secondary | ICD-10-CM | POA: Insufficient documentation

## 2019-03-05 LAB — NOVEL CORONAVIRUS, NAA: SARS-CoV-2, NAA: NOT DETECTED

## 2020-02-06 ENCOUNTER — Encounter: Payer: Self-pay | Admitting: Nurse Practitioner

## 2020-02-06 ENCOUNTER — Other Ambulatory Visit: Payer: Self-pay

## 2020-02-06 ENCOUNTER — Ambulatory Visit (INDEPENDENT_AMBULATORY_CARE_PROVIDER_SITE_OTHER): Payer: BC Managed Care – PPO | Admitting: Nurse Practitioner

## 2020-02-06 VITALS — BP 112/71 | HR 71 | Temp 98.2°F | Ht 68.9 in | Wt 212.4 lb

## 2020-02-06 DIAGNOSIS — R5383 Other fatigue: Secondary | ICD-10-CM

## 2020-02-06 DIAGNOSIS — Z8659 Personal history of other mental and behavioral disorders: Secondary | ICD-10-CM | POA: Diagnosis not present

## 2020-02-06 DIAGNOSIS — R21 Rash and other nonspecific skin eruption: Secondary | ICD-10-CM

## 2020-02-06 DIAGNOSIS — E669 Obesity, unspecified: Secondary | ICD-10-CM | POA: Insufficient documentation

## 2020-02-06 DIAGNOSIS — Z20822 Contact with and (suspected) exposure to covid-19: Secondary | ICD-10-CM

## 2020-02-06 DIAGNOSIS — N529 Male erectile dysfunction, unspecified: Secondary | ICD-10-CM | POA: Insufficient documentation

## 2020-02-06 DIAGNOSIS — F411 Generalized anxiety disorder: Secondary | ICD-10-CM

## 2020-02-06 DIAGNOSIS — N522 Drug-induced erectile dysfunction: Secondary | ICD-10-CM

## 2020-02-06 DIAGNOSIS — E6609 Other obesity due to excess calories: Secondary | ICD-10-CM

## 2020-02-06 DIAGNOSIS — Z6831 Body mass index (BMI) 31.0-31.9, adult: Secondary | ICD-10-CM

## 2020-02-06 MED ORDER — ESCITALOPRAM OXALATE 10 MG PO TABS: 10.0000 mg | ORAL_TABLET | Freq: Every day | ORAL | 4 refills | Status: DC

## 2020-02-06 MED ORDER — SILDENAFIL CITRATE 100 MG PO TABS: 50.0000 mg | ORAL_TABLET | Freq: Every day | ORAL | 11 refills | Status: DC | PRN

## 2020-02-06 MED ORDER — VALACYCLOVIR HCL 1 G PO TABS: ORAL_TABLET | ORAL | 1 refills | Status: DC

## 2020-02-06 NOTE — Assessment & Plan Note (Signed)
Chronic, ongoing.  Stable at this time.  Discussed option of changing to Wellbutrin which would have less effect on sexual activity and may benefit ADHD.  At this time would like to continue Lexapro.  Refills sent.  Denies SI/HI.  Return in 6 months to meet new PCP and for annual physical.

## 2020-02-06 NOTE — Patient Instructions (Signed)

## 2020-02-06 NOTE — Assessment & Plan Note (Signed)
Discussed with patient changing to Wellbutrin which would have less effect on sexual activity.  At this time wishes not to change.  Will send in Viagra which he can use as needed.  BP stable in office and no cardiac issues at this time.

## 2020-02-06 NOTE — Progress Notes (Signed)
BP 112/71   Pulse 71   Temp 98.2 F (36.8 C) (Oral)   Ht 5' 8.9" (1.75 m)   Wt 212 lb 6.4 oz (96.3 kg)   SpO2 98%   BMI 31.46 kg/m    Subjective:    Patient ID: Joseph Ochoa, male    DOB: 1985-07-17, 35 y.o.   MRN: [id number]  HPI: Joseph Ochoa is a 35 y.o. male  Chief Complaint  Patient presents with  . Medication Refill    Lexapro    DEPRESSION/ANXIETY Continues on Lexapro 10 MG daily, has been on for a couple years.  Has history of difficulty with focus, in past was on medication for this.  Does have trouble focusing at work and doing tasks at home.    He does endorse having issues achieving erection and maintaining erection for about one year.  Currently is married.    Would like a referral to dermatology for skin assessment.  He is also requesting Covid antibody testing today. Mood status: stable Satisfied with current treatment?: yes Symptom severity: moderate  Duration of current treatment : chronic Side effects: no Medication compliance: good compliance Psychotherapy/counseling: in the past Depressed mood: yes Anxious mood: yes Anhedonia: no Significant weight loss or gain: no Insomnia:  none Fatigue: yes Feelings of worthlessness or guilt: no Impaired concentration/indecisiveness: yes Suicidal ideations: no Hopelessness: no Crying spells: no Depression screen [facility] 2/9 02/06/2020 12/06/2018  Decreased Interest 0 1  Down, Depressed, Hopeless 0 1  PHQ - 2 Score 0 2  Altered sleeping 0 0  Tired, decreased energy 1 1  Change in appetite 0 0  Feeling bad or failure about yourself  0 0  Trouble concentrating 1 0  Moving slowly or fidgety/restless 0 0  Suicidal thoughts 0 0  PHQ-9 Score 2 3  Difficult doing work/chores Not difficult at all Not difficult at all   GAD 7 : Generalized Anxiety Score 02/06/2020 12/06/2018  Nervous, Anxious, on Edge 2 1  Control/stop worrying 1 0  Worry too much - different things 2 1  Trouble relaxing 2 1  Restless 2 1   Easily annoyed or irritable 0 1  Afraid - awful might happen 1 0  Total GAD 7 Score 10 5  Anxiety Difficulty Somewhat difficult Not difficult at all    Relevant past medical, surgical, family and social history reviewed and updated as indicated. Interim medical history since our last visit reviewed. Allergies and medications reviewed and updated.  Review of Systems  Constitutional: Positive for fatigue. Negative for activity change, diaphoresis and fever.  Respiratory: Negative for cough, chest tightness, shortness of breath and wheezing.   Cardiovascular: Negative for chest pain, palpitations and leg swelling.  Gastrointestinal: Negative.   Neurological: Negative.   Psychiatric/Behavioral: Positive for decreased concentration. Negative for self-injury, sleep disturbance and suicidal ideas. The patient is nervous/anxious.     Per HPI unless specifically indicated above     Objective:    BP 112/71   Pulse 71   Temp 98.2 F (36.8 C) (Oral)   Ht 5' 8.9" (1.75 m)   Wt 212 lb 6.4 oz (96.3 kg)   SpO2 98%   BMI 31.46 kg/m   Wt Readings from Last 3 Encounters:  02/06/20 212 lb 6.4 oz (96.3 kg)  12/12/18 202 lb (91.6 kg)  12/06/18 201 lb 6.4 oz (91.4 kg)    Physical Exam Vitals and nursing note reviewed.  Constitutional:      General: He is awake. He is not  in acute distress.    Appearance: He is well-developed and well-groomed. He is obese. He is not ill-appearing.  HENT:     Head: Normocephalic and atraumatic.     Right Ear: Hearing normal. No drainage.     Left Ear: Hearing normal. No drainage.  Eyes:     General: Lids are normal.        Right eye: No discharge.        Left eye: No discharge.     Conjunctiva/sclera: Conjunctivae normal.     Pupils: Pupils are equal, round, and reactive to light.  Neck:     Thyroid: No thyromegaly.     Vascular: No carotid bruit or JVD.     Trachea: Trachea normal.  Cardiovascular:     Rate and Rhythm: Normal rate and regular  rhythm.     Heart sounds: Normal heart sounds, S1 normal and S2 normal. No murmur heard. No gallop.   Pulmonary:     Effort: Pulmonary effort is normal. No accessory muscle usage or respiratory distress.     Breath sounds: Normal breath sounds.  Abdominal:     General: Bowel sounds are normal.     Palpations: Abdomen is soft.  Musculoskeletal:        General: Normal range of motion.     Cervical back: Normal range of motion and neck supple.     Right lower leg: No edema.     Left lower leg: No edema.  Skin:    General: Skin is warm and dry.     Capillary Refill: Capillary refill takes less than 2 seconds.  Neurological:     Mental Status: He is alert and oriented to person, place, and time.  Psychiatric:        Attention and Perception: Attention normal.        Mood and Affect: Mood normal.        Speech: Speech normal.        Behavior: Behavior normal. Behavior is cooperative.        Thought Content: Thought content normal.     Results for orders placed or performed in visit on 03/04/19  Novel Coronavirus, NAA ([facility])   Specimen: Nasopharyngeal(NP) swabs in vial transport medium   NASOPHARYNGE  TESTING  Result Value Ref Range   SARS-CoV-2, NAA Not Detected Not Detected      Assessment & Plan:   Problem List Items Addressed This Visit      Other   Generalized anxiety disorder - Primary    Chronic, ongoing.  Stable at this time.  Discussed option of changing to Wellbutrin which would have less effect on sexual activity and may benefit ADHD.  At this time would like to continue Lexapro.  Refills sent.  Denies SI/HI.  Return in 6 months to meet new PCP and for annual physical.      Relevant Medications   escitalopram (LEXAPRO) 10 MG tablet   History of ADHD    Referral placed to attention specialist for initial work-up, once on stable regimen have discussed with patient can take over refills, but would require annual drug screen and every 3 months visits.       Relevant Orders   Ambulatory referral to Psychiatry   Erectile dysfunction    Discussed with patient changing to Wellbutrin which would have less effect on sexual activity.  At this time wishes not to change.  Will send in Viagra which he can use as needed.  BP stable in office  and no cardiac issues at this time.      Obesity    Recommended eating smaller high protein, low fat meals more frequently and exercising 30 mins a day 5 times a week with a goal of 10-15lb weight loss in the next 3 months. Patient voiced their understanding and motivation to adhere to these recommendations.        Other Visit Diagnoses    Fatigue, unspecified type       Would like testosterone rechecked, will get first thing in morning labs   Relevant Orders   Testosterone, free, total([facility])   Close exposure to COVID-19 virus       Would like Covid antibody testing, ordered today   Relevant Orders   SAR CoV2 Serology (COVID 19)AB(IGG)IA   Rash       Referral to dermatology per request for annual exam   Relevant Orders   Ambulatory referral to Dermatology       Follow up plan: Return in about 6 months (around 08/05/2020) for Annual physical -- with new PCP.

## 2020-02-06 NOTE — Assessment & Plan Note (Signed)
Recommended eating smaller high protein, low fat meals more frequently and exercising 30 mins a day 5 times a week with a goal of 10-15lb weight loss in the next 3 months. Patient voiced their understanding and motivation to adhere to these recommendations.  

## 2020-02-06 NOTE — Assessment & Plan Note (Signed)
Referral placed to attention specialist for initial work-up, once on stable regimen have discussed with patient can take over refills, but would require annual drug screen and every 3 months visits.

## 2020-02-07 NOTE — Progress Notes (Signed)
Contacted via [place] evening Ronalee Belts, your labs have returned.  It appears you have no antibodies at this time to Covid.  It appears that they released and drew the Testosterone levels, I had wanted to get an early morning peek at them, but on review these ones at this time are in normal range as well.  I am waiting on free testosterone only and will alert you if this returns abnormal.  Any questions?

## 2020-02-08 LAB — TESTOSTERONE, FREE, TOTAL, SHBG
Sex Hormone Binding: 37.3 nmol/L (ref 16.5–55.9)
Testosterone, Free: 8.9 pg/mL (ref 8.7–25.1)
Testosterone: 447 ng/dL (ref 264–916)

## 2020-02-08 LAB — SAR COV2 SEROLOGY (COVID19)AB(IGG),IA
SARS-CoV-2 Semi-Quant IgG Ab: <13 AU/mL (ref <13.0)
SARS-CoV-2 Spike Ab Interp: NEGATIVE

## 2020-02-13 ENCOUNTER — Other Ambulatory Visit: Payer: BC Managed Care – PPO

## 2020-03-01 ENCOUNTER — Telehealth: Payer: Self-pay | Admitting: Physician Assistant

## 2020-03-01 ENCOUNTER — Telehealth: Payer: Self-pay | Admitting: *Deleted

## 2020-03-01 DIAGNOSIS — U071 COVID-19: Secondary | ICD-10-CM

## 2020-03-01 MED ORDER — PROMETHAZINE-DM 6.25-15 MG/5ML PO SYRP: 5.0000 mL | ORAL_SOLUTION | Freq: Four times a day (QID) | ORAL | 0 refills | Status: DC | PRN

## 2020-03-01 MED ORDER — FLUTICASONE PROPIONATE 50 MCG/ACT NA SUSP: 2.0000 | Freq: Every day | NASAL | 0 refills | Status: DC

## 2020-03-01 MED ORDER — ALBUTEROL SULFATE HFA 108 (90 BASE) MCG/ACT IN AERS: 2.0000 | INHALATION_SPRAY | Freq: Four times a day (QID) | RESPIRATORY_TRACT | 0 refills | Status: DC | PRN

## 2020-03-01 NOTE — Progress Notes (Signed)
I have spent 5 minutes in review of e-visit questionnaire, review and updating patient chart, medical decision making and response to patient.   Kindell Strada Cody Belma Dyches, PA-C    

## 2020-03-01 NOTE — Progress Notes (Signed)
Sent message to patient for clarification on where he was tested and the results. Will see if he can upload a copy of his results. Also want to clarify if symptoms are continual or just the cough remains.

## 2020-03-01 NOTE — Telephone Encounter (Signed)
Attempted to call patient- left message on VM to return call- message sent in [place]

## 2020-03-01 NOTE — Progress Notes (Signed)
E-Visit for Corona Virus Screening  We are sorry you are not feeling well. We are here to help!  You have tested positive for COVID-19, meaning that you were infected with the novel coronavirus and could give the virus to others.  It is vitally important that you stay home so you do not spread it to others.      Please continue isolation at home, for at least 10 days since the start of your symptoms and until you have had 24 hours with no fever (without taking a fever reducer) and with improving of symptoms.  If you have no symptoms but tested positive (or all symptoms resolve after 5 days and you have no fever) you can leave your house but continue to wear a mask around others for an additional 5 days. If you have a fever,continue to stay home until you have had 24 hours of no fever. Most cases improve 5-10 days from onset but we have seen a small number of patients who have gotten worse after the 10 days.  Please be sure to watch for worsening symptoms and remain taking the proper precautions.   Go to the nearest hospital ED for assessment if fever/cough/breathlessness are severe or illness seems like a threat to life.    The following symptoms may appear 2-14 days after exposure: . Fever . Cough . Shortness of breath or difficulty breathing . Chills . Repeated shaking with chills . Muscle pain . Headache . Sore throat . New loss of taste or smell . Fatigue . Congestion or runny nose . Nausea or vomiting . Diarrhea  You have been enrolled in [facility] for COVID-19. Daily you will receive a questionnaire within the [facility] website. Our COVID-19 response team will be monitoring your responses daily.  You can use medication such as A prescription inhaler called Albuterol MDI 90 mcg /actuation 2 puffs every 4 hours as needed for shortness of breath, wheezing, cough, A prescription cough medication called Phenergan DM 6.25 mg/15 mg. You make take one teaspoon / 5 ml every 4-6  hours as needed for cough and A prescription for Fluticasone nasal spray 2 sprays in each nostril one time per day. You should also start Vitamin D3 1000 units daily, Vitamin C 1000 mg daily.   You have tested positive for Covid but because you are not considered high risk you do not qualify for monoclonal antibody infusion.  Supportive care is all that is needed.   You may also take acetaminophen (Tylenol) as needed for fever.  HOME CARE: . Only take medications as instructed by your medical team. . Drink plenty of fluids and get plenty of rest. . A steam or ultrasonic humidifier can help if you have congestion.   GET HELP RIGHT AWAY IF YOU HAVE EMERGENCY WARNING SIGNS.  Call 911 or proceed to your closest emergency facility if: . You develop worsening high fever. . Trouble breathing . Bluish lips or face . Persistent pain or pressure in the chest . New confusion . Inability to wake or stay awake . You cough up blood. . Your symptoms become more severe . Inability to hold down food or fluids  This list is not all possible symptoms. Contact your medical provider for any symptoms that are severe or concerning to you.    Your e-visit answers were reviewed by a board certified advanced clinical practitioner to complete your personal care plan.  Depending on the condition, your plan could have included both over the  counter or prescription medications.  If there is a problem please reply once you have received a response from your provider.  Your safety is important to [place].  If you have drug allergies check your prescription carefully.    You can use MyChart to ask questions about today's visit, request a non-urgent call back, or ask for a work or school excuse for 24 hours related to this e-Visit. If it has been greater than 24 hours you will need to follow up with your provider, or enter a new e-Visit to address those concerns. You will get an e-mail in the next two days asking about  your experience.  I hope that your e-visit has been valuable and will speed your recovery. Thank you for using e-visits.

## 2020-04-09 ENCOUNTER — Ambulatory Visit (LOCAL_COMMUNITY_HEALTH_CENTER): Payer: BC Managed Care – PPO

## 2020-04-09 ENCOUNTER — Other Ambulatory Visit: Payer: Self-pay

## 2020-04-09 DIAGNOSIS — Z111 Encounter for screening for respiratory tuberculosis: Secondary | ICD-10-CM

## 2020-04-12 ENCOUNTER — Other Ambulatory Visit: Payer: Self-pay

## 2020-04-12 ENCOUNTER — Ambulatory Visit (LOCAL_COMMUNITY_HEALTH_CENTER): Payer: Self-pay

## 2020-04-12 DIAGNOSIS — Z111 Encounter for screening for respiratory tuberculosis: Secondary | ICD-10-CM

## 2020-04-12 LAB — TB SKIN TEST
Induration: 0 mm
TB Skin Test: NEGATIVE

## 2020-04-13 ENCOUNTER — Other Ambulatory Visit: Payer: Self-pay

## 2020-05-17 ENCOUNTER — Ambulatory Visit: Payer: Self-pay | Admitting: Dermatology

## 2020-06-25 ENCOUNTER — Other Ambulatory Visit: Payer: Self-pay

## 2020-06-25 ENCOUNTER — Ambulatory Visit (INDEPENDENT_AMBULATORY_CARE_PROVIDER_SITE_OTHER): Payer: BC Managed Care – PPO | Admitting: Nurse Practitioner

## 2020-06-25 ENCOUNTER — Encounter: Payer: Self-pay | Admitting: Nurse Practitioner

## 2020-06-25 VITALS — BP 136/72 | HR 78 | Temp 98.6°F | Ht 69.0 in | Wt 209.6 lb

## 2020-06-25 DIAGNOSIS — N50811 Right testicular pain: Secondary | ICD-10-CM

## 2020-06-25 DIAGNOSIS — M79641 Pain in right hand: Secondary | ICD-10-CM

## 2020-06-25 DIAGNOSIS — F909 Attention-deficit hyperactivity disorder, unspecified type: Secondary | ICD-10-CM

## 2020-06-25 DIAGNOSIS — Z0000 Encounter for general adult medical examination without abnormal findings: Secondary | ICD-10-CM

## 2020-06-25 DIAGNOSIS — Z114 Encounter for screening for human immunodeficiency virus [HIV]: Secondary | ICD-10-CM

## 2020-06-25 DIAGNOSIS — D179 Benign lipomatous neoplasm, unspecified: Secondary | ICD-10-CM

## 2020-06-25 DIAGNOSIS — N50812 Left testicular pain: Secondary | ICD-10-CM

## 2020-06-25 DIAGNOSIS — F411 Generalized anxiety disorder: Secondary | ICD-10-CM | POA: Diagnosis not present

## 2020-06-25 DIAGNOSIS — M79642 Pain in left hand: Secondary | ICD-10-CM

## 2020-06-25 LAB — URINALYSIS, ROUTINE W REFLEX MICROSCOPIC
Bilirubin, UA: NEGATIVE
Glucose, UA: NEGATIVE
Ketones, UA: NEGATIVE
Leukocytes,UA: NEGATIVE
Nitrite, UA: NEGATIVE
Protein,UA: NEGATIVE
RBC, UA: NEGATIVE
Specific Gravity, UA: 1.02 (ref 1.005–1.030)
Urobilinogen, Ur: 0.2 mg/dL (ref 0.2–1.0)
pH, UA: 6 (ref 5.0–7.5)

## 2020-06-25 NOTE — Assessment & Plan Note (Signed)
Chronic.  Patient feels like symptoms are well controlled without medication.  Discussed possibility of starting Wellbutrin to help with ADHD and Anxiety.  Patient would like to discuss Adderrall and have psychiatry evaluation. Return to clinic if symptoms worsen.

## 2020-06-25 NOTE — Progress Notes (Signed)
BP 136/72 (BP Location: Right Arm, Cuff Size: Normal)   Pulse 78   Temp 98.6 F (37 C) (Oral)   Ht 5\' 9"  (1.753 m)   Wt 209 lb 9.6 oz (95.1 kg)   SpO2 97%   BMI 30.95 kg/m    Subjective:    Patient ID: Joseph Ochoa, male    DOB: 05/07/1985, 35 y.o.   MRN: [id number]  HPI: Joseph Ochoa is a 35 y.o. male presenting on 06/25/2020 for comprehensive medical examination. Current medical complaints include:hand pain, testicular pain, and ADHD  He currently lives with: Wife Interim Problems from his last visit: no  ANXIETY Patient states his anxiety is well controlled. He is currently off his medication.  Feels like he does not need.   GAD 7 : Generalized Anxiety Score 02/06/2020 12/06/2018  Nervous, Anxious, on Edge 2 1  Control/stop worrying 1 0  Worry too much - different things 2 1  Trouble relaxing 2 1  Restless 2 1  Easily annoyed or irritable 0 1  Afraid - awful might happen 1 0  Total GAD 7 Score 10 5  Anxiety Difficulty Somewhat difficult Not difficult at all   HAND PAIN Patient states he has pain in both of his hands. Feels like some times when he is holding something his hands lock up.  Denies decrease in grip strength.  Denies weakness, not worse in the mornings, fever, denies radiating arm pain. Does work with his hands everyday. Denies stiffness. Pain is constant.  Nothing makes it worse.   TESTICULAR PAIN Patient states he has had testicular pain for a long time now.  He has been to several doctors and has had inguinal hernia surgery.  States it improved with the surgery but then it returned.  He was sent back to a general surgery did not find another inguinal hernia.  Patient has not seen a Dealer.   ADHD Patient has been diagnosed with ADHD in the past.  He has been off medication for about 5 months. He was previously taking Lexapro. Would like to discuss starting Adderall.  Depression Screen done today and results listed below:  Depression screen [facility] 2/9  02/06/2020 12/06/2018  Decreased Interest 0 1  Down, Depressed, Hopeless 0 1  PHQ - 2 Score 0 2  Altered sleeping 0 0  Tired, decreased energy 1 1  Change in appetite 0 0  Feeling bad or failure about yourself  0 0  Trouble concentrating 1 0  Moving slowly or fidgety/restless 0 0  Suicidal thoughts 0 0  PHQ-9 Score 2 3  Difficult doing work/chores Not difficult at all Not difficult at all    The patient does not have a history of falls. I did complete a risk assessment for falls. A plan of care for falls was documented.   Past Medical History:  Past Medical History:  Diagnosis Date  . Anxiety   . Lyme disease     Surgical History:  Past Surgical History:  Procedure Laterality Date  . INGUINAL HERNIA REPAIR Left   . LYMPHANGIOMA EXCISION Right 2001   on right shoulder    Medications:  Current Outpatient Medications on File Prior to Visit  Medication Sig  . sildenafil (VIAGRA) 100 MG tablet Take 0.5-1 tablets (50-100 mg total) by mouth daily as needed for erectile dysfunction.  . valACYclovir (VALTREX) 1000 MG tablet Take two tabs every 12 hours for a total of two doses   No current facility-administered medications on file prior to  visit.    Allergies:  No Known Allergies  Social History:  Social History   Socioeconomic History  . Marital status: Married    Spouse name: Not on file  . Number of children: Not on file  . Years of education: Not on file  . Highest education level: Not on file  Occupational History  . Not on file  Tobacco Use  . Smoking status: Former Smoker    Quit date: 06/30/2016    Years since quitting: 3.9  . Smokeless tobacco: Never Used  Vaping Use  . Vaping Use: Never used  Substance and Sexual Activity  . Alcohol use: Yes    Comment: socially  . Drug use: Never  . Sexual activity: Yes  Other Topics Concern  . Not on file  Social History Narrative  . Not on file   Social Determinants of Health   Financial Resource Strain: Not  on file  Food Insecurity: Not on file  Transportation Needs: Not on file  Physical Activity: Not on file  Stress: Not on file  Social Connections: Not on file  Intimate Partner Violence: Not on file   Social History   Tobacco Use  Smoking Status Former Smoker  . Quit date: 06/30/2016  . Years since quitting: 3.9  Smokeless Tobacco Never Used   Social History   Substance and Sexual Activity  Alcohol Use Yes   Comment: socially    Family History:  Family History  Problem Relation Age of Onset  . Hyperlipidemia Mother   . Epilepsy Mother   . Hypertension Father   . Prostate cancer Father   . Diabetes Maternal Grandfather   . Glaucoma Maternal Grandfather   . Arthritis Maternal Grandfather   . Hypertension Maternal Grandfather   . Prostate cancer Maternal Grandfather   . Hypertension Paternal Grandfather   . Prostate cancer Paternal Grandfather     Past medical history, surgical history, medications, allergies, family history and social history reviewed with patient today and changes made to appropriate areas of the chart.   Review of Systems  Genitourinary:       Testicular pain  Musculoskeletal:       Bilateral hand pain  Psychiatric/Behavioral: The patient is nervous/anxious.        Decreased concentration   All other ROS negative except what is listed above and in the HPI.      Objective:    BP 136/72 (BP Location: Right Arm, Cuff Size: Normal)   Pulse 78   Temp 98.6 F (37 C) (Oral)   Ht 5\' 9"  (1.753 m)   Wt 209 lb 9.6 oz (95.1 kg)   SpO2 97%   BMI 30.95 kg/m   Wt Readings from Last 3 Encounters:  06/25/20 209 lb 9.6 oz (95.1 kg)  02/06/20 212 lb 6.4 oz (96.3 kg)  12/12/18 202 lb (91.6 kg)    Physical Exam Vitals and nursing note reviewed.  Constitutional:      General: He is not in acute distress.    Appearance: Normal appearance. He is not ill-appearing, toxic-appearing or diaphoretic.  HENT:     Head: Normocephalic.     Right Ear: Tympanic  membrane, ear canal and external ear normal.     Left Ear: Tympanic membrane, ear canal and external ear normal.     Nose: Nose normal. No congestion or rhinorrhea.     Mouth/Throat:     Mouth: Mucous membranes are moist.  Eyes:     General:  Right eye: No discharge.        Left eye: No discharge.     Extraocular Movements: Extraocular movements intact.     Conjunctiva/sclera: Conjunctivae normal.     Pupils: Pupils are equal, round, and reactive to light.  Cardiovascular:     Rate and Rhythm: Normal rate and regular rhythm.     Heart sounds: No murmur heard.   Pulmonary:     Effort: Pulmonary effort is normal. No respiratory distress.     Breath sounds: Normal breath sounds. No wheezing, rhonchi or rales.  Abdominal:     General: Abdomen is flat. Bowel sounds are normal. There is no distension.     Palpations: Abdomen is soft.     Tenderness: There is no abdominal tenderness. There is no guarding.  Genitourinary:    Penis: Normal.      Testes: Normal.  Musculoskeletal:     Cervical back: Normal range of motion and neck supple.  Skin:    General: Skin is warm and dry.     Capillary Refill: Capillary refill takes less than 2 seconds.  Neurological:     General: No focal deficit present.     Mental Status: He is alert and oriented to person, place, and time.     Cranial Nerves: No cranial nerve deficit.     Motor: No weakness.     Deep Tendon Reflexes: Reflexes normal.  Psychiatric:        Mood and Affect: Mood normal.        Behavior: Behavior normal.        Thought Content: Thought content normal.        Judgment: Judgment normal.     Results for orders placed or performed in visit on 04/09/20  TB Skin Test  Result Value Ref Range   TB Skin Test Negative    Induration 0 mm      Assessment & Plan:   Problem List Items Addressed This Visit      Other   Generalized anxiety disorder    Chronic.  Patient feels like symptoms are well controlled without  medication.  Discussed possibility of starting Wellbutrin to help with ADHD and Anxiety.  Patient would like to discuss Adderrall and have psychiatry evaluation. Return to clinic if symptoms worsen.       Other Visit Diagnoses    Annual physical exam    -  Primary   Reviewed health maintance during visit today. Patient declined HPV vaccine. Lab work ordered today.    Relevant Orders   HIV Antibody (routine testing w rflx)   TSH   Lipid panel   CBC with Differential/Platelet   Comprehensive metabolic panel   Urinalysis, Routine w reflex microscopic   Screening for HIV (human immunodeficiency virus)       Relevant Orders   HIV Antibody (routine testing w rflx)   Pain in both hands       Xrays ordered for hands to evaluate for arthritis due to patient using his hands everyday for work. Will make further recommendations based on imaging results.   Relevant Orders   DG Hand Complete Left   DG Hand Complete Right   Pain in both testicles       Referral placed for Urology. Patient has never seen a urologist. Testicular exam negative on exam today.    Relevant Orders   Ambulatory referral to Urology   Attention deficit hyperactivity disorder (ADHD), unspecified ADHD type  Recommend patient have evaluation with psychiatry and discuss medicaiton managment.   Relevant Orders   Ambulatory referral to Psychiatry   Lipoma, unspecified site       Patient requested referral to have area looked at by Dermatology.   Relevant Orders   Ambulatory referral to Dermatology       Discussed aspirin prophylaxis for myocardial infarction prevention and decision was it was not indicated  LABORATORY TESTING:  Health maintenance labs ordered today as discussed above.     IMMUNIZATIONS:   - Tdap: Tetanus vaccination status reviewed: last tetanus booster within 10 years. - Influenza: Postponed to flu season - Pneumovax: Not applicable - Prevnar: Not applicable - HPV: Refused - Zostavax  vaccine: Not applicable  SCREENING: - Colonoscopy: Not applicable  Discussed with patient purpose of the colonoscopy is to detect colon cancer at curable precancerous or early stages   - AAA Screening: Not applicable  -Hearing Test: Not applicable  -Spirometry: Not applicable   PATIENT COUNSELING:    Sexuality: Discussed sexually transmitted diseases, partner selection, use of condoms, avoidance of unintended pregnancy  and contraceptive alternatives.   Advised to avoid cigarette smoking.  I discussed with the patient that most people either abstain from alcohol or drink within safe limits (<=14/week and <=4 drinks/occasion for males, <=7/weeks and <= 3 drinks/occasion for females) and that the risk for alcohol disorders and other health effects rises proportionally with the number of drinks per week and how often a drinker exceeds daily limits.  Discussed cessation/primary prevention of drug use and availability of treatment for abuse.   Diet: Encouraged to adjust caloric intake to maintain  or achieve ideal body weight, to reduce intake of dietary saturated fat and total fat, to limit sodium intake by avoiding high sodium foods and not adding table salt, and to maintain adequate dietary potassium and calcium preferably from fresh fruits, vegetables, and low-fat dairy products.    stressed the importance of regular exercise  Injury prevention: Discussed safety belts, safety helmets, smoke detector, smoking near bedding or upholstery.   Dental health: Discussed importance of regular tooth brushing, flossing, and dental visits.   Follow up plan: NEXT PREVENTATIVE PHYSICAL DUE IN 1 YEAR. Return in about 1 year (around 06/25/2021) for Physical and Fasting labs.

## 2020-06-26 LAB — LIPID PANEL
Chol/HDL Ratio: 2.3 ratio (ref 0.0–5.0)
Cholesterol, Total: 170 mg/dL (ref 100–199)
HDL: 73 mg/dL (ref >39)
LDL Chol Calc (NIH): 80 mg/dL (ref 0–99)
Triglycerides: 92 mg/dL (ref 0–149)
VLDL Cholesterol Cal: 17 mg/dL (ref 5–40)

## 2020-06-26 LAB — CBC WITH DIFFERENTIAL/PLATELET
Basophils Absolute: 0.1 10*3/uL (ref 0.0–0.2)
Basos: 1 %
EOS (ABSOLUTE): 0.3 10*3/uL (ref 0.0–0.4)
Eos: 4 %
Hematocrit: 44.9 % (ref 37.5–51.0)
Hemoglobin: 15.1 g/dL (ref 13.0–17.7)
Immature Grans (Abs): 0 10*3/uL (ref 0.0–0.1)
Immature Granulocytes: 0 %
Lymphocytes Absolute: 1.8 10*3/uL (ref 0.7–3.1)
Lymphs: 26 %
MCH: 30.9 pg (ref 26.6–33.0)
MCHC: 33.6 g/dL (ref 31.5–35.7)
MCV: 92 fL (ref 79–97)
Monocytes Absolute: 0.5 10*3/uL (ref 0.1–0.9)
Monocytes: 7 %
Neutrophils Absolute: 4.4 10*3/uL (ref 1.4–7.0)
Neutrophils: 62 %
Platelets: 231 10*3/uL (ref 150–450)
RBC: 4.89 x10E6/uL (ref 4.14–5.80)
RDW: 11.8 % (ref 11.6–15.4)
WBC: 7.1 10*3/uL (ref 3.4–10.8)

## 2020-06-26 LAB — COMPREHENSIVE METABOLIC PANEL
ALT: 26 IU/L (ref 0–44)
AST: 29 IU/L (ref 0–40)
Albumin/Globulin Ratio: 2 (ref 1.2–2.2)
Albumin: 4.9 g/dL (ref 4.0–5.0)
Alkaline Phosphatase: 59 IU/L (ref 44–121)
BUN/Creatinine Ratio: 13 (ref 9–20)
BUN: 14 mg/dL (ref 6–20)
Bilirubin Total: 0.9 mg/dL (ref 0.0–1.2)
CO2: 24 mmol/L (ref 20–29)
Calcium: 9.9 mg/dL (ref 8.7–10.2)
Chloride: 101 mmol/L (ref 96–106)
Creatinine, Ser: 1.1 mg/dL (ref 0.76–1.27)
Globulin, Total: 2.4 g/dL (ref 1.5–4.5)
Glucose: 106 mg/dL — ABNORMAL HIGH (ref 65–99)
Potassium: 3.8 mmol/L (ref 3.5–5.2)
Sodium: 140 mmol/L (ref 134–144)
Total Protein: 7.3 g/dL (ref 6.0–8.5)
eGFR: 91 mL/min/{1.73_m2} (ref >59)

## 2020-06-26 LAB — HIV ANTIBODY (ROUTINE TESTING W REFLEX): HIV Screen 4th Generation wRfx: NONREACTIVE

## 2020-06-26 LAB — TSH: TSH: 1.32 u[IU]/mL (ref 0.450–4.500)

## 2020-06-29 NOTE — Progress Notes (Signed)
Hi Joseph Ochoa.  Your lab work looks good.  No evidence of HIV, thyroid problems, or high cholesterol.  Your liver, kidneys, electrolytes, urine, and complete blood count are all normal. Please let me know if you have any questions.

## 2020-07-08 ENCOUNTER — Other Ambulatory Visit: Payer: Self-pay

## 2020-07-08 DIAGNOSIS — N50819 Testicular pain, unspecified: Secondary | ICD-10-CM

## 2020-07-09 ENCOUNTER — Encounter: Payer: Self-pay | Admitting: Urology

## 2020-07-09 ENCOUNTER — Other Ambulatory Visit: Payer: Self-pay

## 2020-07-09 ENCOUNTER — Ambulatory Visit: Payer: BC Managed Care – PPO | Admitting: Urology

## 2020-07-09 ENCOUNTER — Other Ambulatory Visit
Admission: RE | Admit: 2020-07-09 | Discharge: 2020-07-09 | Disposition: A | Discharge status: Home / Self Care | Payer: BC Managed Care – PPO | Attending: Urology | Admitting: Urology

## 2020-07-09 VITALS — BP 169/89 | HR 87 | Ht 70.0 in | Wt 210.0 lb

## 2020-07-09 DIAGNOSIS — N5082 Scrotal pain: Secondary | ICD-10-CM | POA: Diagnosis not present

## 2020-07-09 DIAGNOSIS — D485 Neoplasm of uncertain behavior of skin: Secondary | ICD-10-CM | POA: Diagnosis not present

## 2020-07-09 DIAGNOSIS — N50819 Testicular pain, unspecified: Secondary | ICD-10-CM | POA: Diagnosis not present

## 2020-07-09 DIAGNOSIS — L720 Epidermal cyst: Secondary | ICD-10-CM | POA: Diagnosis not present

## 2020-07-09 DIAGNOSIS — N529 Male erectile dysfunction, unspecified: Secondary | ICD-10-CM

## 2020-07-09 LAB — URINALYSIS, COMPLETE (UACMP) WITH MICROSCOPIC
Bacteria, UA: NONE SEEN
Bilirubin Urine: NEGATIVE
Glucose, UA: NEGATIVE mg/dL
Hgb urine dipstick: NEGATIVE
Ketones, ur: NEGATIVE mg/dL
Leukocytes,Ua: NEGATIVE
Nitrite: NEGATIVE
Protein, ur: NEGATIVE mg/dL
Specific Gravity, Urine: <1.005 — ABNORMAL LOW (ref 1.005–1.030)
pH: 5.5 (ref 5.0–8.0)

## 2020-07-09 NOTE — Progress Notes (Signed)
07/09/2020 3:54 PM   Joseph Ochoa 04-May-1985 [id number]  Referring provider: Jon Billings, NP [address] [place],  [place] [address]  Chief Complaint  Patient presents with   New Patient (Initial Visit)    Pain in both testicles     HPI: 35 year old male who presents today for further evaluation of scrotal pain.  He mentioned this to his primary care physician and his annual follow-up on Jun 25, 2020.  His urinalysis and HIV testing were negative.  At that time, he had a genital exam which was unremarkable.  No scrotal imaging recently.    He reports that he has a history of chronic scrotal pain.  This is greater than 5 or more years ago when he was up in [place].  He had multiple serial scrotal ultrasounds all of which were normal.  He was diagnosed with a left inguinal hernia about 5 years ago and underwent left laparoscopic inguinal hernia repair.  He reports chronic testicular pain.  Is been worse over the past year or so.  Tends to be left greater than right.  It will come and go randomly without exacerbating or alleviating factors.  It lasts anywhere from several minutes to several hours at a time.  He denies any associated urinary symptoms.  He sought out pelvic floor physical therapy and started about 6 weeks ago and has had 6 sessions now.  He cannot tell if it is helping or not.  No STI exposures.  He also reports today that he has some mild erectile dysfunction.  He has been using Cialis with good result.  He follows regularly with the primary care provider and has had all of his routine screening including cholesterol.  He is a little bit hypertensive today but is anxious.   PMH: Past Medical History:  Diagnosis Date   Anxiety    Lyme disease     Surgical History: Past Surgical History:  Procedure Laterality Date   INGUINAL HERNIA REPAIR Left    LYMPHANGIOMA EXCISION Right 2001   on right shoulder    Home Medications:  Allergies as of  07/09/2020   No Known Allergies      Medication List        Accurate as of July 09, 2020  3:54 PM. If you have any questions, ask your nurse or doctor.          sildenafil 100 MG tablet Commonly known as: Viagra Take 0.5-1 tablets (50-100 mg total) by mouth daily as needed for erectile dysfunction.   valACYclovir 1000 MG tablet Commonly known as: VALTREX Take two tabs every 12 hours for a total of two doses        Allergies: No Known Allergies  Family History: Family History  Problem Relation Age of Onset   Hyperlipidemia Mother    Epilepsy Mother    Hypertension Father    Prostate cancer Father    Diabetes Maternal Grandfather    Glaucoma Maternal Grandfather    Arthritis Maternal Grandfather    Hypertension Maternal Grandfather    Prostate cancer Maternal Grandfather    Hypertension Paternal Grandfather    Prostate cancer Paternal Grandfather     Social History:  reports that he quit smoking about 4 years ago. His smoking use included cigarettes. He has never used smokeless tobacco. He reports current alcohol use. He reports that he does not use drugs.   Physical Exam: BP (!) 169/89   Pulse 87   Ht 5\' 10"  (1.778 m)  Wt 210 lb (95.3 kg)   BMI 30.13 kg/m   Constitutional:  Alert and oriented, No acute distress. HEENT: [place] AT, moist mucus membranes.  Trachea midline, no masses. Cardiovascular: No clubbing, cyanosis, or edema. Respiratory: Normal respiratory effort, no increased work of breathing. GI: Abdomen is soft, nontender, nondistended, no abdominal masses GU: Normal bilateral descended testicles with normal cord and structures.  No masses.  No recurrent inguinal hernias appreciated.  Circumcised phallus with orthotopic meatus without discharge. Skin: No rashes, bruises or suspicious lesions. Neurologic: Grossly intact, no focal deficits, moving all 4 extremities. Psychiatric: Normal mood and affect.  Laboratory Data: Lab Results  Component Value  Date   WBC 7.1 06/25/2020   HGB 15.1 06/25/2020   HCT 44.9 06/25/2020   MCV 92 06/25/2020   PLT 231 06/25/2020    Lab Results  Component Value Date   CREATININE 1.10 06/25/2020    Lab Results  Component Value Date   TESTOSTERONE 447 02/06/2020    Urinalysis    Component Value Date/Time   COLORURINE YELLOW 07/09/2020 1503   APPEARANCEUR CLEAR 07/09/2020 1503   APPEARANCEUR Clear 06/25/2020 1547   LABSPEC <1.005 (L) 07/09/2020 1503   PHURINE 5.5 07/09/2020 1503   GLUCOSEU NEGATIVE 07/09/2020 1503   HGBUR NEGATIVE 07/09/2020 1503   BILIRUBINUR NEGATIVE 07/09/2020 1503   BILIRUBINUR Negative 06/25/2020 1547   KETONESUR NEGATIVE 07/09/2020 1503   PROTEINUR NEGATIVE 07/09/2020 1503   UROBILINOGEN 0.2 11/13/2018 0849   NITRITE NEGATIVE 07/09/2020 1503   LEUKOCYTESUR NEGATIVE 07/09/2020 1503    Lab Results  Component Value Date   BACTERIA NONE SEEN 07/09/2020    Assessment & Plan:    1. Scrotal pain Chronic without any GU pathology, normal exam  Urinalysis today is also negative  Suspect pelvic floor dysfunction/chronic scrotal pain is underlying issue.  We discussed supportive care including scrotal support, NSAIDs as needed, and agree with physical therapy.  No indication for follow-up imaging especially in light of previous negative imaging times multiple and normal exam today.  2. Erectile dysfunction, unspecified erectile dysfunction type Mild, likely anxiety related  Agree with Cialis as needed   Return if symptoms worsen or fail to improve.  Hollice Espy, MD  [facility] [address], [place], [place] [address] [phone]

## 2020-10-13 ENCOUNTER — Telehealth: Payer: BC Managed Care – PPO

## 2020-10-13 NOTE — Telephone Encounter (Signed)
Copied from [place] [id number]. Topic: Medical Record Request - Provider/Facility Request >> Oct 08, 2020  1:50 PM [name] wrote: Requesting last 5 years of Medical Records Joseph Ochoa calling from [facility]: [phone]  Requesting for life insurance

## 2020-10-20 ENCOUNTER — Encounter: Payer: Self-pay | Admitting: General Surgery

## 2020-11-07 ENCOUNTER — Encounter: Payer: Self-pay | Admitting: Urology

## 2020-11-07 DIAGNOSIS — N5082 Scrotal pain: Secondary | ICD-10-CM

## 2020-12-30 ENCOUNTER — Encounter: Payer: Self-pay | Admitting: Internal Medicine

## 2020-12-30 ENCOUNTER — Ambulatory Visit: Payer: BC Managed Care – PPO | Admitting: Internal Medicine

## 2020-12-30 ENCOUNTER — Other Ambulatory Visit: Payer: Self-pay

## 2020-12-30 VITALS — BP 126/76 | HR 62 | Temp 98.2°F | Ht 70.08 in | Wt 215.6 lb

## 2020-12-30 DIAGNOSIS — M791 Myalgia, unspecified site: Secondary | ICD-10-CM | POA: Diagnosis not present

## 2020-12-30 DIAGNOSIS — M25519 Pain in unspecified shoulder: Secondary | ICD-10-CM | POA: Diagnosis not present

## 2020-12-30 MED ORDER — CYCLOBENZAPRINE HCL 5 MG PO TABS: 5.0000 mg | ORAL_TABLET | Freq: Every day | ORAL | 0 refills | Status: DC

## 2020-12-30 MED ORDER — LIDOCAINE 5 % EX PTCH: 1.0000 | MEDICATED_PATCH | CUTANEOUS | 0 refills | Status: DC

## 2020-12-30 NOTE — Patient Instructions (Signed)
Neck Exercises Ask your health care provider which exercises are safe for you. Do exercises exactly as told by your health care provider and adjust them as directed. It is normal to feel mild stretching, pulling, tightness, or discomfort as you do these exercises. Stop right away if you feel sudden pain or your pain gets worse. Do not begin these exercises until told by your health care provider. Neck exercises can be important for many reasons. They can improve strength and maintain flexibility in your neck, which will help your upper back and prevent neck pain. Stretching exercises Rotation neck stretching  Sit in a chair or stand up. Place your feet flat on the floor, shoulder-width apart. Slowly turn your head (rotate) to the right until a slight stretch is felt. Turn it all the way to the right so you can look over your right shoulder. Do not tilt or tip your head. Hold this position for 10-30 seconds. Slowly turn your head (rotate) to the left until a slight stretch is felt. Turn it all the way to the left so you can look over your left shoulder. Do not tilt or tip your head. Hold this position for 10-30 seconds. Repeat __________ times. Complete this exercise __________ times a day. Neck retraction  Sit in a sturdy chair or stand up. Look straight ahead. Do not bend your neck. Use your fingers to push your chin backward (retraction). Do not bend your neck for this movement. Continue to face straight ahead. If you are doing the exercise properly, you will feel a slight sensation in your throat and a stretch at the back of your neck. Hold the stretch for 1-2 seconds. Repeat __________ times. Complete this exercise __________ times a day. Strengthening exercises Neck press  Lie on your back on a firm bed or on the floor with a pillow under your head. Use your neck muscles to push your head down on the pillow and straighten your spine. Hold the position as well as you can. Keep your head  facing up (in a neutral position) and your chin tucked. Slowly count to 5 while holding this position. Repeat __________ times. Complete this exercise __________ times a day. Isometrics These are exercises in which you strengthen the muscles in your neck while keeping your neck still (isometrics). Sit in a supportive chair and place your hand on your forehead. Keep your head and face facing straight ahead. Do not flex or extend your neck while doing isometrics. Push forward with your head and neck while pushing back with your hand. Hold for 10 seconds. Do the sequence again, this time putting your hand against the back of your head. Use your head and neck to push backward against the hand pressure. Finally, do the same exercise on either side of your head, pushing sideways against the pressure of your hand. Repeat __________ times. Complete this exercise __________ times a day. Prone head lifts  Lie face-down (prone position), resting on your elbows so that your chest and upper back are raised. Start with your head facing downward, near your chest. Position your chin either on or near your chest. Slowly lift your head upward. Lift until you are looking straight ahead. Then continue lifting your head as far back as you can comfortably stretch. Hold your head up for 5 seconds. Then slowly lower it to your starting position. Repeat __________ times. Complete this exercise __________ times a day. Supine head lifts  Lie on your back (supine position), bending your knees   to point to the ceiling and keeping your feet flat on the floor. Lift your head slowly off the floor, raising your chin toward your chest. Hold for 5 seconds. Repeat __________ times. Complete this exercise __________ times a day. Scapular retraction  Stand with your arms at your sides. Look straight ahead. Slowly pull both shoulders (scapulae) backward and downward (retraction) until you feel a stretch between your shoulder  blades in your upper back. Hold for 10-30 seconds. Relax and repeat. Repeat __________ times. Complete this exercise __________ times a day. Contact a health care provider if: Your neck pain or discomfort gets worse when you do an exercise. Your neck pain or discomfort does not improve within 2 hours after you exercise. If you have any of these problems, stop exercising right away. Do not do the exercises again unless your health care provider says that you can. Get help right away if: You develop sudden, severe neck pain. If this happens, stop exercising right away. Do not do the exercises again unless your health care provider says that you can. This information is not intended to replace advice given to you by your health care provider. Make sure you discuss any questions you have with your health care provider. Document Revised: 07/13/2020 Document Reviewed: 07/13/2020 Elsevier Patient Education  2022 [employer]  

## 2020-12-30 NOTE — Telephone Encounter (Signed)
Records sent via [facility]

## 2020-12-30 NOTE — Progress Notes (Signed)
BP 126/76   Pulse 62   Temp 98.2 F (36.8 C) (Oral)   Ht 5' 10.08" (1.78 m)   Wt 215 lb 9.6 oz (97.8 kg)   SpO2 98%   BMI 30.87 kg/m    Subjective:    Patient ID: Joseph Ochoa, male    DOB: 05-Dec-1985, 35 y.o.   MRN: [id number]  Chief Complaint  Patient presents with  . Neck Pain    Radiating into the left shoulder, hard to move head with out moving entire body. Started on Saturday.     HPI: Joseph Ochoa is a 35 y.o. male  Neck Pain  This is a new problem. The current episode started in the past 7 days. The pain is associated with a twisting injury and a sleep position. The pain is present in the left side. The quality of the pain is described as cramping. The pain is at a severity of 5/10 (doesnt hurt when not moving his neck). The pain is mild.  Shoulder Pain  This is a new (monday was better,since tuesday worse. was at a hotel for work) problem.   Chief Complaint  Patient presents with  . Neck Pain    Radiating into the left shoulder, hard to move head with out moving entire body. Started on Saturday.     Relevant past medical, surgical, family and social history reviewed and updated as indicated. Interim medical history since our last visit reviewed. Allergies and medications reviewed and updated.  Review of Systems  Musculoskeletal:  Positive for neck pain.   Per HPI unless specifically indicated above     Objective:    BP 126/76   Pulse 62   Temp 98.2 F (36.8 C) (Oral)   Ht 5' 10.08" (1.78 m)   Wt 215 lb 9.6 oz (97.8 kg)   SpO2 98%   BMI 30.87 kg/m   Wt Readings from Last 3 Encounters:  12/30/20 215 lb 9.6 oz (97.8 kg)  07/09/20 210 lb (95.3 kg)  06/25/20 209 lb 9.6 oz (95.1 kg)    Physical Exam Vitals and nursing note reviewed.  Constitutional:      General: He is not in acute distress.    Appearance: Normal appearance. He is not ill-appearing or diaphoretic.  HENT:     Head: Normocephalic and atraumatic.  Cardiovascular:     Rate and  Rhythm: Normal rate and regular rhythm.  Musculoskeletal:        General: No swelling, tenderness, deformity or signs of injury.     Cervical back: Normal range of motion and neck supple. No rigidity or tenderness.     Right lower leg: No edema.     Left lower leg: No edema.     Comments: Decreased ROM of the neck / shoulder pain   Neurological:     Mental Status: He is alert.    Results for orders placed or performed during the hospital encounter of 07/09/20  Urinalysis, Complete w Microscopic  Result Value Ref Range   Color, Urine YELLOW YELLOW   APPearance CLEAR CLEAR   Specific Gravity, Urine <1.005 (L) 1.005 - 1.030   pH 5.5 5.0 - 8.0   Glucose, UA NEGATIVE NEGATIVE mg/dL   Hgb urine dipstick NEGATIVE NEGATIVE   Bilirubin Urine NEGATIVE NEGATIVE   Ketones, ur NEGATIVE NEGATIVE mg/dL   Protein, ur NEGATIVE NEGATIVE mg/dL   Nitrite NEGATIVE NEGATIVE   Leukocytes,Ua NEGATIVE NEGATIVE   Squamous Epithelial / LPF 0-5 0 - 5   WBC,  UA 0-5 0 - 5 WBC/hpf   RBC / HPF 0-5 0 - 5 RBC/hpf   Bacteria, UA NONE SEEN NONE SEEN        Current Outpatient Medications:  .  cyclobenzaprine (FLEXERIL) 5 MG tablet, Take 1 tablet (5 mg total) by mouth at bedtime., Disp: 20 tablet, Rfl: 0 .  lidocaine (LIDODERM) 5 %, Place 1 patch onto the skin daily. Remove & Discard patch within 12 hours or as directed by MD, Disp: 30 patch, Rfl: 0 .  sildenafil (VIAGRA) 100 MG tablet, Take 0.5-1 tablets (50-100 mg total) by mouth daily as needed for erectile dysfunction., Disp: 5 tablet, Rfl: 11 .  valACYclovir (VALTREX) 1000 MG tablet, TAKE 2 TABLETS BY MOUTH EVERY 12 HOURS FOR A TOTAL OF 2 DOSES, Disp: 8 tablet, Rfl: 1    Assessment & Plan:  Neck pain : musculoskeletal pain  ? Ms sprain  Will need to start pt on flexeril / NSAIDS for such     Problem List Items Addressed This Visit       Other   Muscle ache - Primary   Shoulder pain     No orders of the defined types were placed in this  encounter.    Meds ordered this encounter  Medications  . cyclobenzaprine (FLEXERIL) 5 MG tablet    Sig: Take 1 tablet (5 mg total) by mouth at bedtime.    Dispense:  20 tablet    Refill:  0  . lidocaine (LIDODERM) 5 %    Sig: Place 1 patch onto the skin daily. Remove & Discard patch within 12 hours or as directed by MD    Dispense:  30 patch    Refill:  0     Follow up plan: No follow-ups on file.

## 2020-12-31 ENCOUNTER — Other Ambulatory Visit: Payer: Self-pay | Admitting: Nurse Practitioner

## 2020-12-31 DIAGNOSIS — M791 Myalgia, unspecified site: Secondary | ICD-10-CM | POA: Insufficient documentation

## 2020-12-31 DIAGNOSIS — M25519 Pain in unspecified shoulder: Secondary | ICD-10-CM | POA: Insufficient documentation

## 2021-01-01 NOTE — Telephone Encounter (Signed)
Requested Prescriptions  Pending Prescriptions Disp Refills  . valACYclovir (VALTREX) 1000 MG tablet [Pharmacy Med Name: VALACYCLOVIR 1GM TABLETS] 8 tablet 1    Sig: TAKE 2 TABLETS BY MOUTH EVERY 12 HOURS FOR A TOTAL OF 2 DOSES     Antimicrobials:  Antiviral Agents - Anti-Herpetic Passed - 12/31/2020 10:03 AM      Passed - Valid encounter within last 12 months    Recent Outpatient Visits          2 days ago Muscle ache   [place] Vigg, Avanti, MD   6 months ago Annual physical exam   [place], NP   11 months ago Generalized anxiety disorder   [place], [place] T, NP   2 years ago Left groin pain   [facility] Merrie Roof [place], [place]

## 2021-03-09 ENCOUNTER — Ambulatory Visit: Payer: Self-pay

## 2021-03-09 NOTE — Telephone Encounter (Signed)
Summary: Severe nerve pain, numbness in arm   [phone] Pt is experiencing severe nerve pain + numbness in arm. All nurses busy, advised to send clinical call      Chief Complaint: Left arm, shoulder and neck pain Symptoms: Seen recently with this Frequency: Started Monday again after moving furniture Pertinent Negatives: Patient denies  Disposition: [] ED /[] Urgent Care (no appt availability in office) / [x] Appointment(In office/virtual)/ []  [place] [facility]/ [] Home Care/ [] Refused Recommended Disposition /[] [place] [facility]/ []  Follow-up with PCP Additional Notes: Appointment tomorrow, but asking to be seen today if possible. Please advise pt.  Reason for Disposition  Numbness (i.e., loss of sensation) in hand or fingers  Answer Assessment - Initial Assessment Questions 1. ONSET: "When did the pain start?"     Last month, worse Monday 2. LOCATION: "Where is the pain located?"     Neck, left arm and shoulder 3. PAIN: "How bad is the pain?" (Scale 1-10; or mild, moderate, severe)   - MILD (1-3): doesn't interfere with normal activities   - MODERATE (4-7): interferes with normal activities (e.g., work or school) or awakens from sleep   - SEVERE (8-10): excruciating pain, unable to do any normal activities, unable to hold a cup of water     Worse at night -8-9 4. WORK OR EXERCISE: "Has there been any recent work or exercise that involved this part of the body?"     Yes 5. CAUSE: "What do you think is causing the arm pain?"     Unsure 6. OTHER SYMPTOMS: "Do you have any other symptoms?" (e.g., neck pain, swelling, rash, fever, numbness, weakness)     Numbness, tingling 7. PREGNANCY: "Is there any chance you are pregnant?" "When was your last menstrual period?"     N/a  Protocols used: Arm Pain-A-AH

## 2021-03-10 ENCOUNTER — Other Ambulatory Visit: Payer: Self-pay | Admitting: Nurse Practitioner

## 2021-03-10 ENCOUNTER — Ambulatory Visit
Admission: RE | Admit: 2021-03-10 | Discharge: 2021-03-10 | Disposition: A | Discharge status: Home / Self Care | Payer: BC Managed Care – PPO | Attending: Family Medicine | Admitting: Family Medicine

## 2021-03-10 ENCOUNTER — Ambulatory Visit
Admission: RE | Admit: 2021-03-10 | Discharge: 2021-03-10 | Disposition: A | Discharge status: Home / Self Care | Payer: BC Managed Care – PPO | Source: Ambulatory Visit | Attending: Family Medicine | Admitting: Family Medicine

## 2021-03-10 ENCOUNTER — Encounter: Payer: Self-pay | Admitting: Family Medicine

## 2021-03-10 ENCOUNTER — Ambulatory Visit: Payer: BC Managed Care – PPO | Admitting: Family Medicine

## 2021-03-10 ENCOUNTER — Other Ambulatory Visit: Payer: Self-pay

## 2021-03-10 VITALS — BP 120/79 | HR 63 | Temp 98.2°F | Ht 70.08 in | Wt 217.6 lb

## 2021-03-10 DIAGNOSIS — M25512 Pain in left shoulder: Secondary | ICD-10-CM

## 2021-03-10 MED ORDER — KETOROLAC TROMETHAMINE 60 MG/2ML IM SOLN
30.0000 mg | Freq: Once | INTRAMUSCULAR | Status: AC
  Administered 2021-03-10: 30 mg via INTRAMUSCULAR

## 2021-03-10 MED ORDER — GABAPENTIN 100 MG PO CAPS: 100.0000 mg | ORAL_CAPSULE | Freq: Every day | ORAL | 3 refills | Status: DC

## 2021-03-10 MED ORDER — CYCLOBENZAPRINE HCL 10 MG PO TABS: 5.0000 mg | ORAL_TABLET | Freq: Every day | ORAL | 1 refills | Status: DC

## 2021-03-10 NOTE — Progress Notes (Signed)
BP 120/79    Pulse 63    Temp 98.2 F (36.8 C) (Oral)    Ht 5' 10.08" (1.78 m)    Wt 217 lb 9.6 oz (98.7 kg)    SpO2 98%    BMI 31.15 kg/m    Subjective:    Patient ID: Joseph Ochoa, male    DOB: 1985-11-17, 36 y.o.   MRN: [id number]  HPI: Delvon Chipps is a 36 y.o. male  Chief Complaint  Patient presents with   Shoulder Pain    Left shoulder pain since since December, patient states it has been getting worse since Saturday   SHOULDER PAIN Duration: 2 months, significantly worse in the past 4-5 days  Involved shoulder: left Mechanism of injury: unknown Location: trap Onset:sudden Severity: 4/10  Quality:  sharp and numb and tingling Frequency: intermittent Radiation: yes Aggravating factors: unknown  Alleviating factors: advil  Status: worse Treatments attempted:  muscle relaxer, rest, ice, heat, sling, APAP, and ibuprofen  Relief with NSAIDs?:  mild Weakness: no Numbness: yes Decreased grip strength: no Redness: no Swelling: no Bruising: no Fevers: no  Relevant past medical, surgical, family and social history reviewed and updated as indicated. Interim medical history since our last visit reviewed. Allergies and medications reviewed and updated.  Review of Systems  Constitutional: Negative.   Respiratory: Negative.    Cardiovascular: Negative.   Gastrointestinal: Negative.   Musculoskeletal:  Positive for arthralgias, myalgias and neck pain. Negative for back pain, gait problem, joint swelling and neck stiffness.  Skin: Negative.   Neurological:  Positive for numbness. Negative for dizziness, tremors, seizures, syncope, facial asymmetry, speech difficulty, weakness, light-headedness and headaches.  Psychiatric/Behavioral: Negative.     Per HPI unless specifically indicated above     Objective:    BP 120/79    Pulse 63    Temp 98.2 F (36.8 C) (Oral)    Ht 5' 10.08" (1.78 m)    Wt 217 lb 9.6 oz (98.7 kg)    SpO2 98%    BMI 31.15 kg/m   Wt Readings  from Last 3 Encounters:  03/10/21 217 lb 9.6 oz (98.7 kg)  12/30/20 215 lb 9.6 oz (97.8 kg)  07/09/20 210 lb (95.3 kg)    Physical Exam Vitals and nursing note reviewed.  Constitutional:      General: He is not in acute distress.    Appearance: Normal appearance. He is not ill-appearing, toxic-appearing or diaphoretic.  HENT:     Head: Normocephalic and atraumatic.     Right Ear: External ear normal.     Left Ear: External ear normal.     Nose: Nose normal.     Mouth/Throat:     Mouth: Mucous membranes are moist.     Pharynx: Oropharynx is clear.  Eyes:     General: No scleral icterus.       Right eye: No discharge.        Left eye: No discharge.     Extraocular Movements: Extraocular movements intact.     Conjunctiva/sclera: Conjunctivae normal.     Pupils: Pupils are equal, round, and reactive to light.  Cardiovascular:     Rate and Rhythm: Normal rate and regular rhythm.     Pulses: Normal pulses.     Heart sounds: Normal heart sounds. No murmur heard.   No friction rub. No gallop.  Pulmonary:     Effort: Pulmonary effort is normal. No respiratory distress.     Breath sounds: Normal breath sounds. No stridor.  No wheezing, rhonchi or rales.  Chest:     Chest wall: No tenderness.  Musculoskeletal:        General: Normal range of motion.     Cervical back: Normal range of motion and neck supple.  Skin:    General: Skin is warm and dry.     Capillary Refill: Capillary refill takes less than 2 seconds.     Coloration: Skin is not jaundiced or pale.     Findings: No bruising, erythema, lesion or rash.  Neurological:     General: No focal deficit present.     Mental Status: He is alert and oriented to person, place, and time. Mental status is at baseline.  Psychiatric:        Mood and Affect: Mood normal.        Behavior: Behavior normal.        Thought Content: Thought content normal.        Judgment: Judgment normal.    Results for orders placed or performed during  the hospital encounter of 07/09/20  Urinalysis, Complete w Microscopic  Result Value Ref Range   Color, Urine YELLOW YELLOW   APPearance CLEAR CLEAR   Specific Gravity, Urine <1.005 (L) 1.005 - 1.030   pH 5.5 5.0 - 8.0   Glucose, UA NEGATIVE NEGATIVE mg/dL   Hgb urine dipstick NEGATIVE NEGATIVE   Bilirubin Urine NEGATIVE NEGATIVE   Ketones, ur NEGATIVE NEGATIVE mg/dL   Protein, ur NEGATIVE NEGATIVE mg/dL   Nitrite NEGATIVE NEGATIVE   Leukocytes,Ua NEGATIVE NEGATIVE   Squamous Epithelial / LPF 0-5 0 - 5   WBC, UA 0-5 0 - 5 WBC/hpf   RBC / HPF 0-5 0 - 5 RBC/hpf   Bacteria, UA NONE SEEN NONE SEEN      Assessment & Plan:   Problem List Items Addressed This Visit       Other   Shoulder pain - Primary    Will check x-rays and treat with flexeril and gabapentin. Stretches given. May need PT. Recheck 1 month.      Relevant Medications   ketorolac (TORADOL) injection 30 mg (Start on 03/10/2021  9:45 AM)   Other Relevant Orders   DG Cervical Spine Complete   DG Shoulder Left     Follow up plan: Return in about 4 weeks (around 04/07/2021), or with PCP.

## 2021-03-10 NOTE — Telephone Encounter (Signed)
Requested medication (s) are due for refill today: Yes  Requested medication (s) are on the active medication list: Yes  Last refill:  02/06/20  Future visit scheduled: No  Notes to clinic:  Prescription expired.    Requested Prescriptions  Pending Prescriptions Disp Refills   sildenafil (VIAGRA) 100 MG tablet [Pharmacy Med Name: SILDENAFIL 100MG  TABLETS] 5 tablet 11    Sig: TAKE 1/2 TO 1 TABLET(50 TO 100 MG) BY MOUTH DAILY AS NEEDED FOR ERECTILE DYSFUNCTION     Urology: Erectile Dysfunction Agents Passed - 03/10/2021  9:47 AM      Passed - AST in normal range and within 360 days    AST  Date Value Ref Range Status  06/25/2020 29 0 - 40 IU/L Final          Passed - ALT in normal range and within 360 days    ALT  Date Value Ref Range Status  06/25/2020 26 0 - 44 IU/L Final          Passed - Last BP in normal range    BP Readings from Last 1 Encounters:  03/10/21 120/79          Passed - Valid encounter within last 12 months    Recent Outpatient Visits           Today Acute pain of left shoulder   [place], Megan P, DO   2 months ago Muscle ache   [place] Vigg, Avanti, MD   8 months ago Annual physical exam   [facility] Jon Billings, NP   1 year ago Generalized anxiety disorder   [place], Henrine Screws T, NP   2 years ago Left groin pain   [place], Lilia [place], [place]       Future Appointments             In 4 weeks Jon Billings, NP [facility], [place]

## 2021-03-10 NOTE — Assessment & Plan Note (Signed)
Will check x-rays and treat with flexeril and gabapentin. Stretches given. May need PT. Recheck 1 month.

## 2021-04-08 ENCOUNTER — Ambulatory Visit: Payer: BC Managed Care – PPO | Admitting: Nurse Practitioner

## 2021-05-06 ENCOUNTER — Other Ambulatory Visit: Payer: Self-pay | Admitting: Nurse Practitioner

## 2021-05-06 NOTE — Telephone Encounter (Signed)
Requested medication (s) are due for refill today: yes ? ?Requested medication (s) are on the active medication list: yes ? ?Last refill: 02/06/20 ? ?Future visit scheduled: no ? ?Notes to clinic:  Unable to refill per protocol, Rx expired.  ? ? ?  ?Requested Prescriptions  ?Pending Prescriptions Disp Refills  ? escitalopram (LEXAPRO) 10 MG tablet [Pharmacy Med Name: ESCITALOPRAM '10MG'$  TABLETS] 90 tablet 4  ?  Sig: TAKE 1 TABLET(10 MG) BY MOUTH DAILY  ?  ? Psychiatry:  Antidepressants - SSRI Passed - 05/06/2021  8:32 AM  ?  ?  Passed - Valid encounter within last 6 months  ?  Recent Outpatient Visits   ? ?      ? 1 month ago Acute pain of left shoulder  ? [place], Megan P, DO  ? 4 months ago Muscle ache  ? [place], MD  ? 10 months ago Annual physical exam  ? [place], NP  ? 1 year ago Generalized anxiety disorder  ? [place], [place] T, NP  ? 2 years ago Left groin pain  ? [facility] [place], [place]  ? ?  ?  ? ?  ?  ?  ? ? ?

## 2021-05-20 ENCOUNTER — Encounter: Payer: Self-pay | Admitting: Family Medicine

## 2021-05-20 ENCOUNTER — Ambulatory Visit: Payer: BC Managed Care – PPO | Admitting: Family Medicine

## 2021-05-20 VITALS — BP 150/65 | HR 105 | Temp 98.1°F | Wt 210.6 lb

## 2021-05-20 DIAGNOSIS — M4722 Other spondylosis with radiculopathy, cervical region: Secondary | ICD-10-CM | POA: Diagnosis not present

## 2021-05-20 DIAGNOSIS — W57XXXA Bitten or stung by nonvenomous insect and other nonvenomous arthropods, initial encounter: Secondary | ICD-10-CM | POA: Diagnosis not present

## 2021-05-20 MED ORDER — GABAPENTIN 100 MG PO CAPS: ORAL_CAPSULE | ORAL | 2 refills | Status: DC

## 2021-05-20 MED ORDER — DOXYCYCLINE HYCLATE 100 MG PO TABS: 100.0000 mg | ORAL_TABLET | Freq: Two times a day (BID) | ORAL | 0 refills | Status: DC

## 2021-05-20 NOTE — Assessment & Plan Note (Signed)
Not improving. Will get him into PT and increase his gabapentin. Follow up 1 month, if not significantly better, will get MRI.  ?

## 2021-05-20 NOTE — Progress Notes (Signed)
? ?BP (!) 150/65   Pulse (!) 105   Temp 98.1 ?F (36.7 ?C)   Wt 210 lb 9.6 oz (95.5 kg)   SpO2 100%   BMI 30.15 kg/m?   ? ?Subjective:  ? ? Patient ID: Joseph Ochoa, male    DOB: Apr 18, 1985, 36 y.o.   MRN: [id number] ? ?HPI: ?Bijon Mineer is a 36 y.o. male ? ?Chief Complaint  ?Patient presents with  ? Shoulder Pain  ?  Patient states he is here to follow up on shoulder pain, states it has not gotten better.   ? Tick Removal  ?  Patient states he had multiple tick bites that he noticed on Sunday. Patient states he has a lump in his right armpit and groin area from tick bite. Patient has also noticed a lump on the left side of his neck   ? ?NECK PAIN FOLLOW UP ?Diagnosis: cervical DJD ?Status: uncontrolled ?Treatments attempted: stretches and gabapentin  ?Compliant with recommended treatment: average ?Relief with NSAIDs?:  no ?Location:L shoulder ?Duration:months ?Severity: moderate ?Quality: sharp, numb and tingling ?Frequency: intermittent ?Radiation: L arm ?Aggravating factors: unknown ?Alleviating factors: advil and gabapentin ?Weakness:  no ?Paresthesias / decreased sensation:  yes  ?Fevers:  no ? ?Tick Bite ?Duration: couple of days ?Location:  L side of his neck, R armpit and groin ?Onset: sudden ?Itching: yes ?Status: stable ?Fever: no ?Chills: no ?headache: no ?Muscle pain: no ?Rash: no ? ? ? ?Relevant past medical, surgical, family and social history reviewed and updated as indicated. Interim medical history since our last visit reviewed. ?Allergies and medications reviewed and updated. ? ?Review of Systems  ?Constitutional: Negative.   ?Respiratory: Negative.    ?Cardiovascular: Negative.   ?Gastrointestinal: Negative.   ?Musculoskeletal:  Positive for myalgias, neck pain and neck stiffness. Negative for arthralgias, back pain, gait problem and joint swelling.  ?Skin: Negative.   ?Neurological:  Positive for numbness. Negative for dizziness, tremors, seizures, syncope, facial asymmetry, speech  difficulty, weakness, light-headedness and headaches.  ?Psychiatric/Behavioral: Negative.    ? ?Per HPI unless specifically indicated above ? ?   ?Objective:  ?  ?BP (!) 150/65   Pulse (!) 105   Temp 98.1 ?F (36.7 ?C)   Wt 210 lb 9.6 oz (95.5 kg)   SpO2 100%   BMI 30.15 kg/m?   ?Wt Readings from Last 3 Encounters:  ?05/20/21 210 lb 9.6 oz (95.5 kg)  ?03/10/21 217 lb 9.6 oz (98.7 kg)  ?12/30/20 215 lb 9.6 oz (97.8 kg)  ?  ?Physical Exam ?Vitals and nursing note reviewed.  ?Constitutional:   ?   General: He is not in acute distress. ?   Appearance: Normal appearance. He is not ill-appearing, toxic-appearing or diaphoretic.  ?HENT:  ?   Head: Normocephalic and atraumatic.  ?   Right Ear: External ear normal.  ?   Left Ear: External ear normal.  ?   Nose: Nose normal.  ?   Mouth/Throat:  ?   Mouth: Mucous membranes are moist.  ?   Pharynx: Oropharynx is clear.  ?Eyes:  ?   General: No scleral icterus.    ?   Right eye: No discharge.     ?   Left eye: No discharge.  ?   Extraocular Movements: Extraocular movements intact.  ?   Conjunctiva/sclera: Conjunctivae normal.  ?   Pupils: Pupils are equal, round, and reactive to light.  ?Cardiovascular:  ?   Rate and Rhythm: Normal rate and regular rhythm.  ?  Pulses: Normal pulses.  ?   Heart sounds: Normal heart sounds. No murmur heard. ?  No friction rub. No gallop.  ?Pulmonary:  ?   Effort: Pulmonary effort is normal. No respiratory distress.  ?   Breath sounds: Normal breath sounds. No stridor. No wheezing, rhonchi or rales.  ?Chest:  ?   Chest wall: No tenderness.  ?Musculoskeletal:     ?   General: Normal range of motion.  ?   Cervical back: Normal range of motion and neck supple.  ?Skin: ?   General: Skin is warm and dry.  ?   Capillary Refill: Capillary refill takes less than 2 seconds.  ?   Coloration: Skin is not jaundiced or pale.  ?   Findings: No bruising, erythema, lesion or rash.  ?   Comments: Bites with swelling and redness  ?Neurological:  ?   General: No  focal deficit present.  ?   Mental Status: He is alert and oriented to person, place, and time. Mental status is at baseline.  ?Psychiatric:     ?   Mood and Affect: Mood normal.     ?   Behavior: Behavior normal.     ?   Thought Content: Thought content normal.     ?   Judgment: Judgment normal.  ? ? ?Results for orders placed or performed during the hospital encounter of 07/09/20  ?Urinalysis, Complete w Microscopic  ?Result Value Ref Range  ? Color, Urine YELLOW YELLOW  ? APPearance CLEAR CLEAR  ? Specific Gravity, Urine <1.005 (L) 1.005 - 1.030  ? pH 5.5 5.0 - 8.0  ? Glucose, UA NEGATIVE NEGATIVE mg/dL  ? Hgb urine dipstick NEGATIVE NEGATIVE  ? Bilirubin Urine NEGATIVE NEGATIVE  ? Ketones, ur NEGATIVE NEGATIVE mg/dL  ? Protein, ur NEGATIVE NEGATIVE mg/dL  ? Nitrite NEGATIVE NEGATIVE  ? Leukocytes,Ua NEGATIVE NEGATIVE  ? Squamous Epithelial / LPF 0-5 0 - 5  ? WBC, UA 0-5 0 - 5 WBC/hpf  ? RBC / HPF 0-5 0 - 5 RBC/hpf  ? Bacteria, UA NONE SEEN NONE SEEN  ? ?   ?Assessment & Plan:  ? ?Problem List Items Addressed This Visit   ? ?  ? Nervous and Auditory  ? Osteoarthritis of spine with radiculopathy, cervical region - Primary  ?  Not improving. Will get him into PT and increase his gabapentin. Follow up 1 month, if not significantly better, will get MRI.  ? ?  ?  ? Relevant Medications  ? gabapentin (NEURONTIN) 100 MG capsule  ? Other Relevant Orders  ? Ambulatory referral to Physical Therapy  ? ?Other Visit Diagnoses   ? ? Tick bite, unspecified site, initial encounter      ? Will check tick labs and start doxycycline. May need to extend to 3 weeks if + labs.   ? Relevant Orders  ? Lyme Disease Serology w/Reflex  ? Rocky mtn spotted fvr abs pnl(IgG+IgM)  ? Ehrlichia Antibody Panel  ? Babesia microti Antibody Panel  ? ?  ?  ? ?Follow up plan: ?Return in about 4 weeks (around 06/17/2021). ? ? ? ? ? ?

## 2021-05-26 LAB — LYME DISEASE SEROLOGY W/REFLEX: Lyme Total Antibody EIA: NEGATIVE

## 2021-05-26 LAB — EHRLICHIA ANTIBODY PANEL
E. Chaffeensis (HME) IgM Titer: NEGATIVE
E.Chaffeensis (HME) IgG: NEGATIVE
HGE IgG Titer: NEGATIVE
HGE IgM Titer: NEGATIVE

## 2021-05-26 LAB — ROCKY MTN SPOTTED FVR ABS PNL(IGG+IGM)
RMSF IgG: NEGATIVE
RMSF IgM: 1.85 index — ABNORMAL HIGH (ref 0.00–0.89)

## 2021-05-26 LAB — BABESIA MICROTI ANTIBODY PANEL
Babesia microti IgG: <1:10 {titer}
Babesia microti IgM: <1:10 {titer}

## 2021-05-27 ENCOUNTER — Other Ambulatory Visit: Payer: Self-pay | Admitting: Family Medicine

## 2021-05-27 ENCOUNTER — Encounter: Payer: Self-pay | Admitting: Family Medicine

## 2021-05-27 MED ORDER — DOXYCYCLINE HYCLATE 100 MG PO TABS: 100.0000 mg | ORAL_TABLET | Freq: Two times a day (BID) | ORAL | 0 refills | Status: DC

## 2021-06-03 DIAGNOSIS — M5412 Radiculopathy, cervical region: Secondary | ICD-10-CM | POA: Diagnosis not present

## 2021-06-16 NOTE — Progress Notes (Signed)
BP 130/80 Comment: home blood pressure reading  Pulse 86   Temp 98.3 F (36.8 C) (Oral)   Wt 209 lb 9.6 oz (95.1 kg)   SpO2 98%   BMI 30.01 kg/m    Subjective:    Patient ID: Joseph Ochoa, male    DOB: 1985-08-22, 36 y.o.   MRN: [id number]  HPI: Joseph Ochoa is a 36 y.o. male  Chief Complaint  Patient presents with   Follow-up    RMSF follow up    Groin Swelling    L testicle swelling ongoing x 2 weeks off and on. Denies recent fall. States he did hit his testicles really hard recently about 2 weeks ago, denies testicular pain. Denies hematuria. Denies N/V/D.    Neck Pain     Follow up , pt states he has been doing PT but is still hurting all the time    NECK PAIN FOLLOW UP Patient has been doing physical therapy.  Patient does not feel like the physical therapy hasn't helped much but he has only gone for 2 visits. Does feel like gabapentin is helping but it wears off really quickly. Diagnosis: cervical DJD Status: uncontrolled Treatments attempted: stretches and gabapentin  Compliant with recommended treatment: average Relief with NSAIDs?:  no Location:L shoulder Duration:months Severity: moderate Quality: sharp, numb and tingling Frequency: intermittent Radiation: L arm Aggravating factors: unknown Alleviating factors: advil and gabapentin Weakness:  no Paresthesias / decreased sensation:  yes  Fevers:  no  Tick Bite Duration: couple of days Location:  L side of his neck, R armpit and groin Onset: sudden Itching: yes Status: stable Fever: no Chills: no headache: no Muscle pain: no Rash: no He is completing the course of Doxycline.    Patient states an ax handle hit his left testicle.  It was bruised and went away then came back some. Patient states the swelling reoccurred on Monday.  No discoloration but the swelling is still kind of there.  Does have some pain on the back side of the testicle.  He was on his feet a lot when the pain reoccurred.  Denies any nausea, vomiting.  ELEVATED BLOOD PRESSURE Patient states he has ben checking his blood pressure at home an running 130s/80s.  Relevant past medical, surgical, family and social history reviewed and updated as indicated. Interim medical history since our last visit reviewed. Allergies and medications reviewed and updated.  Review of Systems  Constitutional: Negative.   Respiratory: Negative.    Cardiovascular: Negative.   Gastrointestinal: Negative.  Negative for nausea and vomiting.  Genitourinary:  Positive for scrotal swelling and testicular pain.  Musculoskeletal:  Positive for myalgias, neck pain and neck stiffness. Negative for arthralgias, back pain, gait problem and joint swelling.  Skin: Negative.   Neurological:  Positive for numbness. Negative for dizziness, tremors, seizures, syncope, facial asymmetry, speech difficulty, weakness, light-headedness and headaches.  Psychiatric/Behavioral: Negative.     Per HPI unless specifically indicated above     Objective:    BP 130/80 Comment: home blood pressure reading  Pulse 86   Temp 98.3 F (36.8 C) (Oral)   Wt 209 lb 9.6 oz (95.1 kg)   SpO2 98%   BMI 30.01 kg/m   Wt Readings from Last 3 Encounters:  06/17/21 209 lb 9.6 oz (95.1 kg)  05/20/21 210 lb 9.6 oz (95.5 kg)  03/10/21 217 lb 9.6 oz (98.7 kg)    Physical Exam Vitals and nursing note reviewed. Exam conducted with a chaperone present ([place],  CMA).  Constitutional:      General: He is not in acute distress.    Appearance: Normal appearance. He is not ill-appearing, toxic-appearing or diaphoretic.  HENT:     Head: Normocephalic and atraumatic.     Right Ear: External ear normal.     Left Ear: External ear normal.     Nose: Nose normal.     Mouth/Throat:     Mouth: Mucous membranes are moist.     Pharynx: Oropharynx is clear.  Eyes:     General: No scleral icterus.       Right eye: No discharge.        Left eye: No discharge.      Extraocular Movements: Extraocular movements intact.     Conjunctiva/sclera: Conjunctivae normal.     Pupils: Pupils are equal, round, and reactive to light.  Cardiovascular:     Rate and Rhythm: Normal rate and regular rhythm.     Pulses: Normal pulses.     Heart sounds: Normal heart sounds. No murmur heard.   No friction rub. No gallop.  Pulmonary:     Effort: Pulmonary effort is normal. No respiratory distress.     Breath sounds: Normal breath sounds. No stridor. No wheezing, rhonchi or rales.  Chest:     Chest wall: No tenderness.  Genitourinary:    Testes:        Left: Tenderness present. Mass, swelling, testicular hydrocele or varicocele not present. Left testis is descended. Cremasteric reflex is present.      Comments: No evidence of hematoma. Musculoskeletal:        General: Normal range of motion.     Cervical back: Normal range of motion and neck supple.  Skin:    General: Skin is warm and dry.     Capillary Refill: Capillary refill takes less than 2 seconds.     Coloration: Skin is not jaundiced or pale.     Findings: No bruising, erythema, lesion or rash.  Neurological:     General: No focal deficit present.     Mental Status: He is alert and oriented to person, place, and time. Mental status is at baseline.  Psychiatric:        Mood and Affect: Mood normal.        Behavior: Behavior normal.        Thought Content: Thought content normal.        Judgment: Judgment normal.    Results for orders placed or performed in visit on 06/17/21  Urinalysis, Routine w reflex microscopic  Result Value Ref Range   Specific Gravity, UA 1.020 1.005 - 1.030   pH, UA 6.0 5.0 - 7.5   Color, UA Yellow Yellow   Appearance Ur Clear Clear   Leukocytes,UA Negative Negative   Protein,UA Negative Negative/Trace   Glucose, UA Negative Negative   Ketones, UA Negative Negative   RBC, UA Negative Negative   Bilirubin, UA Negative Negative   Urobilinogen, Ur 0.2 0.2 - 1.0 mg/dL    Nitrite, UA Negative Negative      Assessment & Plan:   Problem List Items Addressed This Visit       Nervous and Auditory   Osteoarthritis of spine with radiculopathy, cervical region    Ongoing.  Gabapentin is helping with symptoms but is wearing off quickly.  Will increase dose from '100mg'$  TID to '300mg'$  TID.  Denies side effects from medication.  Follow up in 2 months for reevaluation.  Call sooner if concerns arise.  Relevant Medications   gabapentin (NEURONTIN) 300 MG capsule   Other Visit Diagnoses     Testicular swelling    -  Primary   Tenderness of L testicle on exam. No other abnormalities. Will order CBC and UA in office today. If abnormal will send patient to Urology.   Relevant Orders   Urinalysis, Routine w reflex microscopic (Completed)   CBC w/Diff        Follow up plan: Return in about 2 months (around 08/17/2021) for Medication Management.

## 2021-06-17 ENCOUNTER — Ambulatory Visit: Payer: BC Managed Care – PPO | Admitting: Nurse Practitioner

## 2021-06-17 ENCOUNTER — Encounter: Payer: Self-pay | Admitting: Nurse Practitioner

## 2021-06-17 VITALS — BP 130/80 | HR 86 | Temp 98.3°F | Wt 209.6 lb

## 2021-06-17 DIAGNOSIS — N5089 Other specified disorders of the male genital organs: Secondary | ICD-10-CM

## 2021-06-17 DIAGNOSIS — M4722 Other spondylosis with radiculopathy, cervical region: Secondary | ICD-10-CM | POA: Diagnosis not present

## 2021-06-17 DIAGNOSIS — M5412 Radiculopathy, cervical region: Secondary | ICD-10-CM | POA: Diagnosis not present

## 2021-06-17 LAB — URINALYSIS, ROUTINE W REFLEX MICROSCOPIC
Bilirubin, UA: NEGATIVE
Glucose, UA: NEGATIVE
Ketones, UA: NEGATIVE
Leukocytes,UA: NEGATIVE
Nitrite, UA: NEGATIVE
Protein,UA: NEGATIVE
RBC, UA: NEGATIVE
Specific Gravity, UA: 1.02 (ref 1.005–1.030)
Urobilinogen, Ur: 0.2 mg/dL (ref 0.2–1.0)
pH, UA: 6 (ref 5.0–7.5)

## 2021-06-17 MED ORDER — GABAPENTIN 300 MG PO CAPS: 300.0000 mg | ORAL_CAPSULE | Freq: Three times a day (TID) | ORAL | 1 refills | Status: DC

## 2021-06-17 NOTE — Assessment & Plan Note (Signed)
Ongoing.  Gabapentin is helping with symptoms but is wearing off quickly.  Will increase dose from '100mg'$  TID to '300mg'$  TID.  Denies side effects from medication.  Follow up in 2 months for reevaluation.  Call sooner if concerns arise.

## 2021-06-17 NOTE — Progress Notes (Signed)
Hi Joseph Ochoa. Your urine was normal.  No concerns at this time.

## 2021-06-18 LAB — CBC WITH DIFFERENTIAL/PLATELET
Basophils Absolute: 0.1 10*3/uL (ref 0.0–0.2)
Basos: 1 %
EOS (ABSOLUTE): 0.3 10*3/uL (ref 0.0–0.4)
Eos: 3 %
Hematocrit: 45.4 % (ref 37.5–51.0)
Hemoglobin: 15.7 g/dL (ref 13.0–17.7)
Immature Grans (Abs): 0 10*3/uL (ref 0.0–0.1)
Immature Granulocytes: 0 %
Lymphocytes Absolute: 2.6 10*3/uL (ref 0.7–3.1)
Lymphs: 28 %
MCH: 31.2 pg (ref 26.6–33.0)
MCHC: 34.6 g/dL (ref 31.5–35.7)
MCV: 90 fL (ref 79–97)
Monocytes Absolute: 0.7 10*3/uL (ref 0.1–0.9)
Monocytes: 7 %
Neutrophils Absolute: 5.6 10*3/uL (ref 1.4–7.0)
Neutrophils: 61 %
Platelets: 353 10*3/uL (ref 150–450)
RBC: 5.04 x10E6/uL (ref 4.14–5.80)
RDW: 11.8 % (ref 11.6–15.4)
WBC: 9.3 10*3/uL (ref 3.4–10.8)

## 2021-06-20 ENCOUNTER — Encounter: Payer: Self-pay | Admitting: Nurse Practitioner

## 2021-06-20 NOTE — Progress Notes (Signed)
Hi Joseph Ochoa. Your blood work and urine were normal.  If you continue to have issues with the testicle I recommend following up with Urology.

## 2021-08-19 ENCOUNTER — Encounter: Payer: Self-pay | Admitting: Nurse Practitioner

## 2021-08-19 ENCOUNTER — Ambulatory Visit: Payer: BC Managed Care – PPO | Admitting: Nurse Practitioner

## 2021-08-19 ENCOUNTER — Other Ambulatory Visit: Payer: Self-pay | Admitting: Family Medicine

## 2021-08-19 VITALS — BP 130/89 | HR 73 | Temp 98.2°F | Wt 207.8 lb

## 2021-08-19 DIAGNOSIS — N5089 Other specified disorders of the male genital organs: Secondary | ICD-10-CM

## 2021-08-19 DIAGNOSIS — M4722 Other spondylosis with radiculopathy, cervical region: Secondary | ICD-10-CM

## 2021-08-19 MED ORDER — GABAPENTIN 400 MG PO CAPS: 400.0000 mg | ORAL_CAPSULE | Freq: Three times a day (TID) | ORAL | 1 refills | Status: DC

## 2021-08-19 NOTE — Assessment & Plan Note (Signed)
Chronic. Ongoing. Not improved with physical therapy. Will increase Gabapentin to '400mg'$  TID.  Recommend he see Orthopedics for further evaluation and management.

## 2021-08-19 NOTE — Progress Notes (Signed)
BP 130/89   Pulse 73   Temp 98.2 F (36.8 C) (Oral)   Wt 207 lb 12.8 oz (94.3 kg)   SpO2 98%   BMI 29.75 kg/m    Subjective:    Patient ID: Joseph Ochoa, male    DOB: 1985/02/16, 36 y.o.   MRN: [id number]  HPI: Joseph Ochoa is a 36 y.o. male  Chief Complaint  Patient presents with   Medication Management   Neck Pain    Worse since last office visit   Testicle Pain     Coming and going since last office visit, primarily in his L testicle.    NECK PAIN Patient states his neck pain hasn't improved since last visit.  The Gabapentin is helping some.  States he has to take the medication TID and in the middle of the night he is waking up in pain and very restless at night.  He went to physical therapy and was told his pain may be worsening with the physical therapy.    TESTICULAR PAIN Patient states the testicular pain is still coming and going.  He did not go back to Urology after our last visit.     Relevant past medical, surgical, family and social history reviewed and updated as indicated. Interim medical history since our last visit reviewed. Allergies and medications reviewed and updated.  Review of Systems  Genitourinary:  Positive for testicular pain.  Musculoskeletal:  Positive for neck pain.    Per HPI unless specifically indicated above     Objective:    BP 130/89   Pulse 73   Temp 98.2 F (36.8 C) (Oral)   Wt 207 lb 12.8 oz (94.3 kg)   SpO2 98%   BMI 29.75 kg/m   Wt Readings from Last 3 Encounters:  08/19/21 207 lb 12.8 oz (94.3 kg)  06/17/21 209 lb 9.6 oz (95.1 kg)  05/20/21 210 lb 9.6 oz (95.5 kg)    Physical Exam Vitals and nursing note reviewed.  Constitutional:      General: He is not in acute distress.    Appearance: Normal appearance. He is not ill-appearing, toxic-appearing or diaphoretic.  HENT:     Head: Normocephalic.     Right Ear: External ear normal.     Left Ear: External ear normal.     Nose: Nose normal. No congestion  or rhinorrhea.     Mouth/Throat:     Mouth: Mucous membranes are moist.  Eyes:     General:        Right eye: No discharge.        Left eye: No discharge.     Extraocular Movements: Extraocular movements intact.     Conjunctiva/sclera: Conjunctivae normal.     Pupils: Pupils are equal, round, and reactive to light.  Cardiovascular:     Rate and Rhythm: Normal rate and regular rhythm.     Heart sounds: No murmur heard. Pulmonary:     Effort: Pulmonary effort is normal. No respiratory distress.     Breath sounds: Normal breath sounds. No wheezing, rhonchi or rales.  Abdominal:     General: Abdomen is flat. Bowel sounds are normal.  Musculoskeletal:     Cervical back: Normal range of motion and neck supple.  Skin:    General: Skin is warm and dry.     Capillary Refill: Capillary refill takes less than 2 seconds.  Neurological:     General: No focal deficit present.     Mental Status: He is  alert and oriented to person, place, and time.  Psychiatric:        Mood and Affect: Mood normal.        Behavior: Behavior normal.        Thought Content: Thought content normal.        Judgment: Judgment normal.     Results for orders placed or performed in visit on 06/17/21  Urinalysis, Routine w reflex microscopic  Result Value Ref Range   Specific Gravity, UA 1.020 1.005 - 1.030   pH, UA 6.0 5.0 - 7.5   Color, UA Yellow Yellow   Appearance Ur Clear Clear   Leukocytes,UA Negative Negative   Protein,UA Negative Negative/Trace   Glucose, UA Negative Negative   Ketones, UA Negative Negative   RBC, UA Negative Negative   Bilirubin, UA Negative Negative   Urobilinogen, Ur 0.2 0.2 - 1.0 mg/dL   Nitrite, UA Negative Negative  CBC w/Diff  Result Value Ref Range   WBC 9.3 3.4 - 10.8 x10E3/uL   RBC 5.04 4.14 - 5.80 x10E6/uL   Hemoglobin 15.7 13.0 - 17.7 g/dL   Hematocrit 45.4 37.5 - 51.0 %   MCV 90 79 - 97 fL   MCH 31.2 26.6 - 33.0 pg   MCHC 34.6 31.5 - 35.7 g/dL   RDW 11.8 11.6 -  15.4 %   Platelets 353 150 - 450 x10E3/uL   Neutrophils 61 Not Estab. %   Lymphs 28 Not Estab. %   Monocytes 7 Not Estab. %   Eos 3 Not Estab. %   Basos 1 Not Estab. %   Neutrophils Absolute 5.6 1.4 - 7.0 x10E3/uL   Lymphocytes Absolute 2.6 0.7 - 3.1 x10E3/uL   Monocytes Absolute 0.7 0.1 - 0.9 x10E3/uL   EOS (ABSOLUTE) 0.3 0.0 - 0.4 x10E3/uL   Basophils Absolute 0.1 0.0 - 0.2 x10E3/uL   Immature Granulocytes 0 Not Estab. %   Immature Grans (Abs) 0.0 0.0 - 0.1 x10E3/uL      Assessment & Plan:   Problem List Items Addressed This Visit       Nervous and Auditory   Osteoarthritis of spine with radiculopathy, cervical region - Primary    Chronic. Ongoing. Not improved with physical therapy. Will increase Gabapentin to '400mg'$  TID.  Recommend he see Orthopedics for further evaluation and management.       Relevant Medications   gabapentin (NEURONTIN) 400 MG capsule   Other Relevant Orders   Ambulatory referral to Orthopedics   Other Visit Diagnoses     Testicular swelling       Has not followed up with Urology since last visit. Recommend following up with Urology for further evaluation.         Follow up plan: Return in about 6 months (around 02/19/2022) for Physical and Fasting labs.

## 2021-08-19 NOTE — Telephone Encounter (Signed)
Requested medication (s) are due for refill today - no  Requested medication (s) are on the active medication list -no  Future visit scheduled -yes  Last refill: unknown  Notes to clinic: medication no longer on current medication list, non delegated Rx  Requested Prescriptions  Pending Prescriptions Disp Refills   cyclobenzaprine (FLEXERIL) 10 MG tablet [Pharmacy Med Name: CYCLOBENZAPRINE '10MG'$  TABLETS] 30 tablet 1    Sig: TAKE 1/2 TO 1 TABLET(5 TO 10 MG) BY MOUTH AT BEDTIME     Not Delegated - Analgesics:  Muscle Relaxants Failed - 08/19/2021 10:11 AM      Failed - This refill cannot be delegated      Passed - Valid encounter within last 6 months    Recent Outpatient Visits           Today Osteoarthritis of spine with radiculopathy, cervical region   [facility] Jon Billings, NP   2 months ago Testicular swelling   [facility] [place], Santiago Glad, NP   3 months ago Osteoarthritis of spine with radiculopathy, cervical region   [facility] [place], DO   5 months ago Acute pain of left shoulder   [place], Megan P, DO   7 months ago Muscle ache   [place], MD       Future Appointments             In 6 months Jon Billings, NP [facility], [place]               Requested Prescriptions  Pending Prescriptions Disp Refills   cyclobenzaprine (FLEXERIL) 10 MG tablet [Pharmacy Med Name: CYCLOBENZAPRINE '10MG'$  TABLETS] 30 tablet 1    Sig: TAKE 1/2 TO 1 TABLET(5 TO 10 MG) BY MOUTH AT BEDTIME     Not Delegated - Analgesics:  Muscle Relaxants Failed - 08/19/2021 10:11 AM      Failed - This refill cannot be delegated      Passed - Valid encounter within last 6 months    Recent Outpatient Visits           Today Osteoarthritis of spine with radiculopathy, cervical region   [facility] Jon Billings, NP   2 months ago Testicular swelling    [place], NP   3 months ago Osteoarthritis of spine with radiculopathy, cervical region   [facility] [place], DO   5 months ago Acute pain of left shoulder   [place], Megan P, DO   7 months ago Muscle ache   [place], MD       Future Appointments             In 6 months Jon Billings, NP [facility], [place]

## 2021-08-26 DIAGNOSIS — M5412 Radiculopathy, cervical region: Secondary | ICD-10-CM | POA: Diagnosis not present

## 2021-09-01 ENCOUNTER — Encounter: Payer: Self-pay | Admitting: Urology

## 2021-09-01 ENCOUNTER — Ambulatory Visit: Payer: BC Managed Care – PPO | Admitting: Urology

## 2021-09-01 VITALS — BP 128/90 | HR 76 | Ht 70.0 in | Wt 210.0 lb

## 2021-09-01 DIAGNOSIS — G8929 Other chronic pain: Secondary | ICD-10-CM

## 2021-09-01 DIAGNOSIS — N50819 Testicular pain, unspecified: Secondary | ICD-10-CM

## 2021-09-01 DIAGNOSIS — N5082 Scrotal pain: Secondary | ICD-10-CM

## 2021-09-01 LAB — URINALYSIS, COMPLETE
Bilirubin, UA: NEGATIVE
Glucose, UA: NEGATIVE
Ketones, UA: NEGATIVE
Leukocytes,UA: NEGATIVE
Nitrite, UA: NEGATIVE
Protein,UA: NEGATIVE
RBC, UA: NEGATIVE
Specific Gravity, UA: 1.025 (ref 1.005–1.030)
Urobilinogen, Ur: 0.2 mg/dL (ref 0.2–1.0)
pH, UA: 6 (ref 5.0–7.5)

## 2021-09-01 LAB — MICROSCOPIC EXAMINATION: Bacteria, UA: NONE SEEN

## 2021-09-01 NOTE — Progress Notes (Signed)
09/01/21 9:14 AM   Joseph Ochoa 01-31-1986 [id number]  Referring provider:  Jon Billings, NP [address] [place],  [place] [address] Chief Complaint  Patient presents with   Testicle Pain       HPI: Joseph Ochoa is a 36 y.o.male with a personal history of scrotal pain and ED, who presents today for further evaluation of testicular pain and swelling.   He had multiple serial scrotal ultrasounds all of which were normal.  He was diagnosed with a left inguinal hernia and underwent left laparoscopic inguinal hernia repair.   He sought out pelvic floor physical therapy and had  sessions. He was referred back to physical therapy and did  not follow-up. He reports he did not receive a call.  He is managed on Cialis.   He reports that he had an injury in testicles a little over a month ago where he hit it very hard, he was seen by his doctor but was not placed on any medication or compressive care.  He reports initially the left side was swollen and bruised in appearance but now the swelling is resolved.  He is has some residual tenderness just above his left testicle.  He was taking OTC ibuprofen to help with this pain and his scrotal pain.   He is on gabapentin.   PMH: Past Medical History:  Diagnosis Date   Anxiety    Lyme disease     Surgical History: Past Surgical History:  Procedure Laterality Date   INGUINAL HERNIA REPAIR Left    LYMPHANGIOMA EXCISION Right 2001   on right shoulder    Home Medications:  Allergies as of 09/01/2021   No Known Allergies      Medication List        Accurate as of September 01, 2021 11:59 PM. If you have any questions, ask your nurse or doctor.          gabapentin 400 MG capsule Commonly known as: NEURONTIN Take 1 capsule (400 mg total) by mouth 3 (three) times daily. Take 1 tab 3x daily   methylPREDNISolone 4 MG Tbpk tablet Commonly known as: MEDROL DOSEPAK Take by mouth as directed.   sildenafil 100 MG  tablet Commonly known as: VIAGRA TAKE 1/2 TO 1 TABLET(50 TO 100 MG) BY MOUTH DAILY AS NEEDED FOR ERECTILE DYSFUNCTION   valACYclovir 1000 MG tablet Commonly known as: VALTREX TAKE 2 TABLETS BY MOUTH EVERY 12 HOURS FOR A TOTAL OF 2 DOSES        Allergies:  No Known Allergies  Family History: Family History  Problem Relation Age of Onset   Hyperlipidemia Mother    Epilepsy Mother    Hypertension Father    Prostate cancer Father    Diabetes Maternal Grandfather    Glaucoma Maternal Grandfather    Arthritis Maternal Grandfather    Hypertension Maternal Grandfather    Prostate cancer Maternal Grandfather    Hypertension Paternal Grandfather    Prostate cancer Paternal Grandfather     Social History:  reports that he quit smoking about 5 years ago. His smoking use included cigarettes. He has never used smokeless tobacco. He reports current alcohol use. He reports that he does not use drugs.   Physical Exam: BP (!) 128/90   Pulse 76   Ht '5\' 10"'$  (1.778 m)   Wt 210 lb (95.3 kg)   BMI 30.13 kg/m   Constitutional:  Alert and oriented, No acute distress. HEENT: [place] AT, moist mucus membranes.  Trachea midline,  no masses. Cardiovascular: No clubbing, cyanosis, or edema. Respiratory: Normal respiratory effort, no increased work of breathing. GU: Normal bilateral descended testicles with normal cord and structures.  No masses.  No recurrent inguinal hernias appreciated.  Circumcised phallus with orthotopic meatus without discharge. Skin: No rashes, bruises or suspicious lesions. Neurologic: Grossly intact, no focal deficits, moving all 4 extremities. Psychiatric: Normal mood and affect.  Laboratory Data:  Lab Results  Component Value Date   CREATININE 1.10 06/25/2020     Assessment & Plan:   1. Chronic testicular pain - Chronic without any  anatomic pathology, normal benign exam - Urinalysis today is also negative   - We reviewed supportive care including scrotal support,  NSAIDs as needed, and physical therapy. -We discussed the very typical nature of chronic testicular pain, difficult to control and he is currently already being treated with gabapentin - He would like referral to physical therapy; he did not hear back from them last year; referral sent.  - Discussed with him today that there is very low yield for further imaging.    Conley Rolls as a Education administrator for Hollice Espy, MD.,have documented all relevant documentation on the behalf of Hollice Espy, MD,as directed by  Hollice Espy, MD while in the presence of Hollice Espy, MD.  I have reviewed the above documentation for accuracy and completeness, and I agree with the above.   Hollice Espy, MD   [facility] [address] [place], [place] [address] [phone]

## 2021-09-16 DIAGNOSIS — M5412 Radiculopathy, cervical region: Secondary | ICD-10-CM | POA: Diagnosis not present

## 2021-09-22 ENCOUNTER — Ambulatory Visit: Payer: BC Managed Care – PPO | Admitting: Physical Therapy

## 2021-09-23 DIAGNOSIS — M5412 Radiculopathy, cervical region: Secondary | ICD-10-CM | POA: Diagnosis not present

## 2021-10-21 DIAGNOSIS — M5412 Radiculopathy, cervical region: Secondary | ICD-10-CM | POA: Diagnosis not present

## 2021-11-18 ENCOUNTER — Other Ambulatory Visit: Payer: Self-pay | Admitting: Nurse Practitioner

## 2021-11-18 DIAGNOSIS — M5412 Radiculopathy, cervical region: Secondary | ICD-10-CM | POA: Diagnosis not present

## 2021-11-18 DIAGNOSIS — M542 Cervicalgia: Secondary | ICD-10-CM | POA: Diagnosis not present

## 2021-11-18 NOTE — Telephone Encounter (Signed)
Requested Prescriptions  Pending Prescriptions Disp Refills  . valACYclovir (VALTREX) 1000 MG tablet [Pharmacy Med Name: VALACYCLOVIR 1GM TABLETS] 8 tablet 1    Sig: TAKE 2 TABLETS BY MOUTH EVERY 12 HOURS FOR A TOTAL OF 2 DOSES     Antimicrobials:  Antiviral Agents - Anti-Herpetic Passed - 11/18/2021  3:43 PM      Passed - Valid encounter within last 12 months    Recent Outpatient Visits          3 months ago Osteoarthritis of spine with radiculopathy, cervical region   [facility] Jon Billings, NP   5 months ago Testicular swelling   [place], NP   6 months ago Osteoarthritis of spine with radiculopathy, cervical region   [facility], Megan P, DO   8 months ago Acute pain of left shoulder   [place], Megan P, DO   10 months ago Muscle ache   [facility] [place], Loman [place], MD

## 2021-12-02 ENCOUNTER — Encounter: Payer: Self-pay | Admitting: Nurse Practitioner

## 2021-12-02 ENCOUNTER — Telehealth: Payer: Self-pay | Admitting: Nurse Practitioner

## 2021-12-02 ENCOUNTER — Ambulatory Visit: Payer: BC Managed Care – PPO | Admitting: Nurse Practitioner

## 2021-12-02 VITALS — BP 125/85 | HR 78 | Temp 97.6°F | Resp 16 | Ht 70.0 in | Wt 214.4 lb

## 2021-12-02 DIAGNOSIS — Z9109 Other allergy status, other than to drugs and biological substances: Secondary | ICD-10-CM | POA: Diagnosis not present

## 2021-12-02 DIAGNOSIS — N5089 Other specified disorders of the male genital organs: Secondary | ICD-10-CM

## 2021-12-02 DIAGNOSIS — E782 Mixed hyperlipidemia: Secondary | ICD-10-CM

## 2021-12-02 DIAGNOSIS — Z7689 Persons encountering health services in other specified circumstances: Secondary | ICD-10-CM

## 2021-12-02 DIAGNOSIS — J3089 Other allergic rhinitis: Secondary | ICD-10-CM | POA: Diagnosis not present

## 2021-12-02 DIAGNOSIS — Z8042 Family history of malignant neoplasm of prostate: Secondary | ICD-10-CM

## 2021-12-02 DIAGNOSIS — E559 Vitamin D deficiency, unspecified: Secondary | ICD-10-CM

## 2021-12-02 DIAGNOSIS — L989 Disorder of the skin and subcutaneous tissue, unspecified: Secondary | ICD-10-CM

## 2021-12-02 NOTE — Telephone Encounter (Signed)
Awaiting 12/02/21 office notes for Dermatology referral-Toni

## 2021-12-02 NOTE — Progress Notes (Signed)
[facility] [place], [place] [address]  Internal MEDICINE  Office Visit Note  Patient Name: Joseph Ochoa  [id number]  [id number]  Date of Service: 12/02/2021   Complaints/HPI Pt is here for establishment of PCP. Chief Complaint  Patient presents with   New Patient (Initial Visit)   HPI Joseph Ochoa presents for a new patient visit to establish care.  Well-appearing 36 year old male with 2 herniated discs in his cervical spine, osteoarthritis, GAD and ADHD.  Lives at home with wife, 4 dogs and 2 cats. No kids.  --poor diet per patient but job is very physically active. He works as an Chief Technology Officer.  --he is a former smoker. Denies illicit drug use. He reports drinks approx 4 beers about 4 days per week.  --has recurrent swelling and pain on the left half of the scrotum, requesting urology consult. --has severe environmental allergies, is on an antihistamine, nasal spray and montelukast. Requesting referral to allergist --requesting dermatology referral for small skin colored bumps on multiple sites.     Current Medication: Outpatient Encounter Medications as of 12/02/2021  Medication Sig   gabapentin (NEURONTIN) 100 MG capsule Take 1-3 capsules nightly   methylPREDNISolone (MEDROL DOSEPAK) 4 MG TBPK tablet Take by mouth as directed.   sildenafil (VIAGRA) 100 MG tablet TAKE 1/2 TO 1 TABLET(50 TO 100 MG) BY MOUTH DAILY AS NEEDED FOR ERECTILE DYSFUNCTION   valACYclovir (VALTREX) 1000 MG tablet TAKE 2 TABLETS BY MOUTH EVERY 12 HOURS FOR A TOTAL OF 2 DOSES   [DISCONTINUED] gabapentin (NEURONTIN) 400 MG capsule Take 1 capsule (400 mg total) by mouth 3 (three) times daily. Take 1 tab 3x daily   No facility-administered encounter medications on file as of 12/02/2021.    Surgical History: Past Surgical History:  Procedure Laterality Date   INGUINAL HERNIA REPAIR Left    LYMPHANGIOMA EXCISION Right 2001   on right shoulder    Medical  History: Past Medical History:  Diagnosis Date   Anxiety    Lyme disease     Family History: Family History  Problem Relation Age of Onset   Hyperlipidemia Mother    Epilepsy Mother    Hypertension Father    Prostate cancer Father    Diabetes Maternal Grandfather    Glaucoma Maternal Grandfather    Arthritis Maternal Grandfather    Hypertension Maternal Grandfather    Prostate cancer Maternal Grandfather    Hypertension Paternal Grandfather    Prostate cancer Paternal Grandfather     Social History   Socioeconomic History   Marital status: Married    Spouse name: Not on file   Number of children: Not on file   Years of education: Not on file   Highest education level: Not on file  Occupational History   Not on file  Tobacco Use   Smoking status: Former    Types: Cigarettes    Quit date: 06/30/2016    Years since quitting: 5.4   Smokeless tobacco: Never  Vaping Use   Vaping Use: Never used  Substance and Sexual Activity   Alcohol use: Yes    Comment: socially   Drug use: Never   Sexual activity: Yes  Other Topics Concern   Not on file  Social History Narrative   Not on file   Social Determinants of Health   Financial Resource Strain: Not on file  Food Insecurity: Not on file  Transportation Needs: Not on file  Physical Activity: Not on file  Stress: Not on  file  Social Connections: Not on file  Intimate Partner Violence: Not on file     Review of Systems  Constitutional:  Negative for chills, fatigue and fever.  HENT: Negative.    Eyes: Negative.   Respiratory: Negative.  Negative for cough, chest tightness, shortness of breath and wheezing.   Cardiovascular: Negative.  Negative for chest pain and palpitations.  Gastrointestinal: Negative.   Genitourinary:  Positive for scrotal swelling (recurrent) and testicular pain (recurrent).  Musculoskeletal:  Positive for arthralgias and neck pain.  Neurological: Negative.   Psychiatric/Behavioral:  The  patient is nervous/anxious.     Vital Signs: BP 125/85 Comment: 142/89  Pulse 78   Temp 97.6 F (36.4 C)   Resp 16   Ht _0  (1.778 m)   Wt 214 lb 6.4 oz (97.3 kg)   SpO2 97%   BMI 30.76 kg/m    Physical Exam Vitals reviewed.  Constitutional:      Appearance: Normal appearance. He is obese.  HENT:     Head: Normocephalic and atraumatic.  Eyes:     Pupils: Pupils are equal, round, and reactive to light.  Cardiovascular:     Rate and Rhythm: Normal rate and regular rhythm.  Pulmonary:     Effort: Pulmonary effort is normal. No respiratory distress.  Neurological:     Mental Status: He is alert and oriented to person, place, and time.  Psychiatric:        Mood and Affect: Mood normal.        Behavior: Behavior normal.       Assessment/Plan: 1. Swelling of left half of scrotum Recurrent swelling and pain of the left half of the scrotum, requesting referral to urology - Ambulatory referral to Urology  2. Skin lesions Referral requested for dermatology for removal of skin lesions as well as TBSE - Ambulatory referral to Dermatology  3. Non-seasonal allergic rhinitis due to other allergic trigger Referred to allergy specialist at [facility] - Ambulatory referral to Allergy  4. Multiple environmental allergies Referred to allergy specialist at [place] referral to Allergy  5. Family history of prostate cancer PSA level ordered due to significant family history of prostate cancer - PSA Total (Reflex To Free)  6. Mixed hyperlipidemia Routine labs ordered - CBC with Differential/Platelet - CMP14+EGFR - Lipid Profile  7. Vitamin D deficiency Routine lab ordered - Vitamin D (25 hydroxy)  8. Encounter to establish care with new doctor Establish care with new PCP, was dissatisfied with previous PCP. Due for routine labs and annual physical exam - CBC with Differential/Platelet - CMP14+EGFR - Lipid Profile - PSA Total (Reflex To Free) - Vitamin D (25  hydroxy)   General Counseling: Joseph Ochoa verbalizes understanding of the findings of todays visit and agrees with plan of treatment. I have discussed any further diagnostic evaluation that may be needed or ordered today. We also reviewed his medications today. he has been encouraged to call the office with any questions or concerns that should arise related to todays visit.    Counseling:  [place] Controlled Substance Database was reviewed by me.  Orders Placed This Encounter  Procedures   CBC with Differential/Platelet   CMP14+EGFR   Lipid Profile   PSA Total (Reflex To Free)   Vitamin D (25 hydroxy)   Ambulatory referral to Urology   Ambulatory referral to Allergy   Ambulatory referral to Dermatology    No orders of the defined types were placed in this encounter.   Return for CPE,  Labs, Devonne Lalani PCP at earliest available opening, also ADHD screening.  Time spent:30 Minutes Time spent with patient included reviewing progress notes, labs, imaging studies, and discussing plan for follow up.    This patient was seen by Jonetta Osgood, FNP-C in collaboration with Dr. Clayborn Bigness as a part of collaborative care agreement.    Nadalyn Deringer R. Valetta Fuller, MSN, FNP-C Internal Medicine

## 2021-12-02 NOTE — Telephone Encounter (Signed)
Awaiting 12/02/21 office notes for Allergy referral-Toni

## 2021-12-05 ENCOUNTER — Telehealth: Payer: Self-pay | Admitting: Nurse Practitioner

## 2021-12-05 NOTE — Telephone Encounter (Signed)
Dermatology referral sent via [facility] to [facility] -Toni 

## 2021-12-05 NOTE — Telephone Encounter (Signed)
Allergy referral faxed to Cristine Radojicic @ [facility]; [phone]

## 2022-01-12 DIAGNOSIS — Z8042 Family history of malignant neoplasm of prostate: Secondary | ICD-10-CM | POA: Diagnosis not present

## 2022-01-12 DIAGNOSIS — L2381 Allergic contact dermatitis due to animal (cat) (dog) dander: Secondary | ICD-10-CM | POA: Diagnosis not present

## 2022-01-12 DIAGNOSIS — J3081 Allergic rhinitis due to animal (cat) (dog) hair and dander: Secondary | ICD-10-CM | POA: Diagnosis not present

## 2022-01-12 DIAGNOSIS — Z7689 Persons encountering health services in other specified circumstances: Secondary | ICD-10-CM | POA: Diagnosis not present

## 2022-01-12 DIAGNOSIS — J3089 Other allergic rhinitis: Secondary | ICD-10-CM | POA: Diagnosis not present

## 2022-01-12 DIAGNOSIS — E782 Mixed hyperlipidemia: Secondary | ICD-10-CM | POA: Diagnosis not present

## 2022-01-12 DIAGNOSIS — E559 Vitamin D deficiency, unspecified: Secondary | ICD-10-CM | POA: Diagnosis not present

## 2022-01-13 ENCOUNTER — Ambulatory Visit (INDEPENDENT_AMBULATORY_CARE_PROVIDER_SITE_OTHER): Payer: BC Managed Care – PPO | Admitting: Nurse Practitioner

## 2022-01-13 ENCOUNTER — Encounter: Payer: Self-pay | Admitting: Nurse Practitioner

## 2022-01-13 VITALS — BP 110/70 | HR 100 | Temp 98.9°F | Resp 16 | Ht 70.0 in | Wt 216.6 lb

## 2022-01-13 DIAGNOSIS — E559 Vitamin D deficiency, unspecified: Secondary | ICD-10-CM

## 2022-01-13 DIAGNOSIS — F902 Attention-deficit hyperactivity disorder, combined type: Secondary | ICD-10-CM

## 2022-01-13 DIAGNOSIS — Z0001 Encounter for general adult medical examination with abnormal findings: Secondary | ICD-10-CM | POA: Diagnosis not present

## 2022-01-13 DIAGNOSIS — Z1339 Encounter for screening examination for other mental health and behavioral disorders: Secondary | ICD-10-CM | POA: Diagnosis not present

## 2022-01-13 LAB — CMP14+EGFR
ALT: 29 IU/L (ref 0–44)
AST: 29 IU/L (ref 0–40)
Albumin/Globulin Ratio: 2.1 (ref 1.2–2.2)
Albumin: 5 g/dL (ref 4.1–5.1)
Alkaline Phosphatase: 60 IU/L (ref 44–121)
BUN/Creatinine Ratio: 12 (ref 9–20)
BUN: 12 mg/dL (ref 6–20)
Bilirubin Total: 1.3 mg/dL — ABNORMAL HIGH (ref 0.0–1.2)
CO2: 26 mmol/L (ref 20–29)
Calcium: 9.7 mg/dL (ref 8.7–10.2)
Chloride: 100 mmol/L (ref 96–106)
Creatinine, Ser: 1 mg/dL (ref 0.76–1.27)
Globulin, Total: 2.4 g/dL (ref 1.5–4.5)
Glucose: 94 mg/dL (ref 70–99)
Potassium: 4 mmol/L (ref 3.5–5.2)
Sodium: 139 mmol/L (ref 134–144)
Total Protein: 7.4 g/dL (ref 6.0–8.5)
eGFR: 101 mL/min/{1.73_m2} (ref >59)

## 2022-01-13 LAB — PSA TOTAL (REFLEX TO FREE): Prostate Specific Ag, Serum: 0.9 ng/mL (ref 0.0–4.0)

## 2022-01-13 LAB — CBC WITH DIFFERENTIAL/PLATELET
Basophils Absolute: 0 10*3/uL (ref 0.0–0.2)
Basos: 1 %
EOS (ABSOLUTE): 0.4 10*3/uL (ref 0.0–0.4)
Eos: 5 %
Hematocrit: 42.7 % (ref 37.5–51.0)
Hemoglobin: 14.7 g/dL (ref 13.0–17.7)
Immature Grans (Abs): 0 10*3/uL (ref 0.0–0.1)
Immature Granulocytes: 0 %
Lymphocytes Absolute: 1.8 10*3/uL (ref 0.7–3.1)
Lymphs: 26 %
MCH: 31.3 pg (ref 26.6–33.0)
MCHC: 34.4 g/dL (ref 31.5–35.7)
MCV: 91 fL (ref 79–97)
Monocytes Absolute: 0.5 10*3/uL (ref 0.1–0.9)
Monocytes: 7 %
Neutrophils Absolute: 4.2 10*3/uL (ref 1.4–7.0)
Neutrophils: 61 %
Platelets: 249 10*3/uL (ref 150–450)
RBC: 4.69 x10E6/uL (ref 4.14–5.80)
RDW: 12.3 % (ref 11.6–15.4)
WBC: 7 10*3/uL (ref 3.4–10.8)

## 2022-01-13 LAB — LIPID PANEL
Chol/HDL Ratio: 2.4 ratio (ref 0.0–5.0)
Cholesterol, Total: 175 mg/dL (ref 100–199)
HDL: 74 mg/dL (ref >39)
LDL Chol Calc (NIH): 87 mg/dL (ref 0–99)
Triglycerides: 75 mg/dL (ref 0–149)
VLDL Cholesterol Cal: 14 mg/dL (ref 5–40)

## 2022-01-13 LAB — VITAMIN D 25 HYDROXY (VIT D DEFICIENCY, FRACTURES): Vit D, 25-Hydroxy: 28.6 ng/mL — ABNORMAL LOW (ref 30.0–100.0)

## 2022-01-13 MED ORDER — AMPHETAMINE-DEXTROAMPHET ER 15 MG PO CP24: 15.0000 mg | ORAL_CAPSULE | ORAL | 0 refills | Status: DC

## 2022-01-13 NOTE — Progress Notes (Signed)
[facility] [place], [place] [address]  Internal MEDICINE  Office Visit Note  Patient Name: Joseph Ochoa  [id number]  [id number]  Date of Service: 01/13/2022  Chief Complaint  Patient presents with   Annual Exam    HPI Joseph Ochoa presents for an annual well visit and physical exam.  Well-appearing 36 y.o. male with osteoarthritis, GAD, ADHD, and erectile dysfunction.  Labs: all labs normal, PSA normal, except slightly low vitamin D and slightly elevated bilirubin at 1.3 with history of elevated bilirubin at 1.6.  New or worsening pain: left groin chronic, waiting to see urology Bilateral knees -- arthritis most likely, has not tried OTC NSAIDs Other concerns: lump in left groin. Waiting to see urology in January --wants to be screening for ADHD today.      01/13/2022   10:03 AM  Depression screen PHQ 2/9  Decreased Interest 0  Down, Depressed, Hopeless 0  PHQ - 2 Score 0     Adult ADHD Self Report Scale (most recent)     Adult ADHD Self-Report Scale (ASRS-v1.1) Symptom Checklist - 01/13/22 1028       Part A   1. How often do you have trouble wrapping up the final details of a project, once the challenging parts have been done? Often  2. How often do you have difficulty getting things done in order when you have to do a task that requires organization? Very Often    3. How often do you have problems remembering appointments or obligations? Sometimes  4. When you have a task that requires a lot of thought, how often do you avoid or delay getting started? Often    5. How often do you fidget or squirm with your hands or feet when you have to sit down for a long time? Very Often  6. How often do you feel overly active and compelled to do things, like you were driven by a motor? Very Often      Part B   7. How often do you make careless mistakes when you have to work on a boring or difficult project? Often  8. How often do you have difficulty keeping  your attention when you are doing boring or repetitive work? Very Often    9. How often do you have difficulty concentrating on what people say to you, even when they are speaking to you directly? Very Often  10. How often do you misplace or have difficulty finding things at home or at work? Often    11. How often are you distracted by activity or noise around you? Very Often  66. How often do you leave your seat in meetings or other situations in which you are expected to remain seated? Very Often    45. How often do you feel restless or fidgety? Very Often  33. How often do you have difficulty unwinding and relaxing when you have time to yourself? Very Often    73. How often do you find yourself talking too much when you are in social situations? Very Often  31. When you are in a conversation, how often do you find yourself finishing the sentences of the people you are talking to, before they can finish them themselves? Very Often    13. How often do you have difficulty waiting your turn in situations when turn taking is required? Very Often  12. How often do you interrupt others when they are busy? Sometimes  Comment   How old were you when these problems first began to occur? 9   was diagnosed with ADHD in childhood                Current Medication: Outpatient Encounter Medications as of 01/13/2022  Medication Sig   amphetamine-dextroamphetamine (ADDERALL XR) 15 MG 24 hr capsule Take 1 capsule by mouth every morning.   gabapentin (NEURONTIN) 100 MG capsule Take 1-3 capsules nightly   sildenafil (VIAGRA) 100 MG tablet TAKE 1/2 TO 1 TABLET(50 TO 100 MG) BY MOUTH DAILY AS NEEDED FOR ERECTILE DYSFUNCTION   valACYclovir (VALTREX) 1000 MG tablet TAKE 2 TABLETS BY MOUTH EVERY 12 HOURS FOR A TOTAL OF 2 DOSES   [DISCONTINUED] methylPREDNISolone (MEDROL DOSEPAK) 4 MG TBPK tablet Take by mouth as directed.   No facility-administered encounter medications on file as of 01/13/2022.     Surgical History: Past Surgical History:  Procedure Laterality Date   INGUINAL HERNIA REPAIR Left    LYMPHANGIOMA EXCISION Right 2001   on right shoulder    Medical History: Past Medical History:  Diagnosis Date   Anxiety    Lyme disease     Family History: Family History  Problem Relation Age of Onset   Hyperlipidemia Mother    Epilepsy Mother    Hypertension Father    Prostate cancer Father    Diabetes Maternal Grandfather    Glaucoma Maternal Grandfather    Arthritis Maternal Grandfather    Hypertension Maternal Grandfather    Prostate cancer Maternal Grandfather    Hypertension Paternal Grandfather    Prostate cancer Paternal Grandfather     Social History   Socioeconomic History   Marital status: Married    Spouse name: Not on file   Number of children: Not on file   Years of education: Not on file   Highest education level: Not on file  Occupational History   Not on file  Tobacco Use   Smoking status: Former    Types: Cigarettes    Quit date: 06/30/2016    Years since quitting: 5.5   Smokeless tobacco: Never  Vaping Use   Vaping Use: Never used  Substance and Sexual Activity   Alcohol use: Yes    Comment: socially   Drug use: Never   Sexual activity: Yes  Other Topics Concern   Not on file  Social History Narrative   Not on file   Social Determinants of Health   Financial Resource Strain: Not on file  Food Insecurity: Not on file  Transportation Needs: Not on file  Physical Activity: Not on file  Stress: Not on file  Social Connections: Not on file  Intimate Partner Violence: Not on file      Review of Systems  Constitutional:  Negative for activity change, appetite change, chills, fatigue, fever and unexpected weight change.  HENT: Negative.  Negative for congestion, ear pain, rhinorrhea, sore throat and trouble swallowing.   Eyes: Negative.   Respiratory: Negative.  Negative for cough, chest tightness, shortness of breath and  wheezing.   Cardiovascular: Negative.  Negative for chest pain and palpitations.  Gastrointestinal: Negative.  Negative for abdominal pain, blood in stool, constipation, diarrhea, nausea and vomiting.  Endocrine: Negative.   Genitourinary: Negative.  Negative for difficulty urinating, dysuria, frequency, hematuria and urgency.  Musculoskeletal: Negative.  Negative for arthralgias, back pain, joint swelling, myalgias and neck pain.  Skin: Negative.  Negative for rash and wound.  Allergic/Immunologic: Negative.  Negative for immunocompromised state.  Neurological:  Negative.  Negative for dizziness, seizures, numbness and headaches.  Hematological: Negative.   Psychiatric/Behavioral:  Positive for decreased concentration and sleep disturbance. Negative for behavioral problems, self-injury and suicidal ideas. The patient is nervous/anxious.     Vital Signs: BP 110/70 Comment: 151/93  Pulse 100   Temp 98.9 F (37.2 C)   Resp 16   Ht '5\' 10"'$  (1.778 m)   Wt 216 lb 9.6 oz (98.2 kg)   SpO2 99%   BMI 31.08 kg/m    Physical Exam Vitals reviewed.  Constitutional:      General: He is awake. He is not in acute distress.    Appearance: Normal appearance. He is well-developed and well-groomed. He is obese. He is not ill-appearing or diaphoretic.  HENT:     Head: Normocephalic and atraumatic.     Right Ear: Tympanic membrane, ear canal and external ear normal.     Left Ear: Tympanic membrane, ear canal and external ear normal.     Nose: Nose normal.     Mouth/Throat:     Lips: Pink.     Mouth: Mucous membranes are moist.     Pharynx: Oropharynx is clear. Uvula midline. No oropharyngeal exudate or posterior oropharyngeal erythema.  Eyes:     General: Lids are normal. Vision grossly intact. Gaze aligned appropriately. No scleral icterus.       Right eye: No discharge.        Left eye: No discharge.     Extraocular Movements: Extraocular movements intact.     Conjunctiva/sclera: Conjunctivae  normal.     Pupils: Pupils are equal, round, and reactive to light.  Neck:     Thyroid: No thyromegaly.     Vascular: No JVD.     Trachea: Trachea and phonation normal. No tracheal deviation.  Cardiovascular:     Rate and Rhythm: Normal rate and regular rhythm.     Pulses: Normal pulses.     Heart sounds: Normal heart sounds, S1 normal and S2 normal. No murmur heard.    No friction rub. No gallop.  Pulmonary:     Effort: Pulmonary effort is normal. No accessory muscle usage or respiratory distress.     Breath sounds: Normal breath sounds. No stridor. No wheezing or rales.  Chest:     Chest wall: No tenderness.  Abdominal:     General: Bowel sounds are normal. There is no distension.     Palpations: Abdomen is soft. There is no shifting dullness, fluid wave, mass or pulsatile mass.     Tenderness: There is no abdominal tenderness. There is no guarding or rebound.  Musculoskeletal:        General: No tenderness or deformity. Normal range of motion.     Cervical back: Normal range of motion and neck supple.     Right lower leg: No edema.     Left lower leg: No edema.  Lymphadenopathy:     Cervical: No cervical adenopathy.  Skin:    General: Skin is warm and dry.     Capillary Refill: Capillary refill takes less than 2 seconds.     Coloration: Skin is not pale.     Findings: No erythema or rash.  Neurological:     Mental Status: He is alert and oriented to person, place, and time.     Cranial Nerves: No cranial nerve deficit.     Motor: No abnormal muscle tone.     Coordination: Coordination normal.     Gait: Gait normal.  Deep Tendon Reflexes: Reflexes are normal and symmetric.  Psychiatric:        Attention and Perception: He is inattentive.        Mood and Affect: Mood normal.        Speech: Speech is rapid and pressured.        Behavior: Behavior is hyperactive. Behavior is cooperative.        Thought Content: Thought content normal. Thought content is not paranoid.  Thought content does not include homicidal or suicidal ideation.        Cognition and Memory: He exhibits impaired recent memory.        Judgment: Judgment normal.        Assessment/Plan: 1. Encounter for routine adult health examination with abnormal findings Age-appropriate preventive screenings and vaccinations discussed, annual physical exam completed. Routine labs for health maintenance drawn prior to office visit and discussed with patient today. PHM updated.   2. Hyperbilirubinemia of non-newborn patient Additional labs and RUQ [place] ordered - Gamma GT - Bilirubin, fractionated (tot/dir/indir) - US Abdomen Limited RUQ (LIVER/GB); Future  3. Vitamin D deficiency Recommended OTC supplement  4. ADHD (attention deficit hyperactivity disorder), combined type Will try adderall XR 15 mg daily, take as prescribed. Will follow up in 4 weeks to reassess effectiveness and dosing.  - amphetamine-dextroamphetamine (ADDERALL XR) 15 MG 24 hr capsule; Take 1 capsule by mouth every morning.  Dispense: 30 capsule; Refill: 0  5. Attention deficit hyperactivity disorder (ADHD) evaluation Evaluated with adult ASRS answers, clinical assessment and exam as well as subjective information and patient's medical history of prior childhood diagnosis.       General Counseling: shaiden aldous understanding of the findings of todays visit and agrees with plan of treatment. I have discussed any further diagnostic evaluation that may be needed or ordered today. We also reviewed his medications today. he has been encouraged to call the office with any questions or concerns that should arise related to todays visit.    Orders Placed This Encounter  Procedures   US Abdomen Limited RUQ (LIVER/GB)   Gamma GT   Bilirubin, fractionated (tot/dir/indir)    Meds ordered this encounter  Medications   amphetamine-dextroamphetamine (ADDERALL XR) 15 MG 24 hr capsule    Sig: Take 1 capsule by mouth every  morning.    Dispense:  30 capsule    Refill:  0    New script fill today    Return in about 4 weeks (around 02/10/2022) for F/U, eval new med, ADHD med check, [place] PCP.   Total time spent:30 Minutes Time spent includes review of chart, medications, test results, and follow up plan with the patient.   [place] Controlled Substance Database was reviewed by me.  This patient was seen by Jonetta Osgood, FNP-C in collaboration with Dr. Clayborn Bigness as a part of collaborative care agreement.  Mychelle Kendra R. Valetta Fuller, MSN, FNP-C Internal medicine

## 2022-01-16 ENCOUNTER — Encounter: Payer: Self-pay | Admitting: Nurse Practitioner

## 2022-01-16 ENCOUNTER — Telehealth: Payer: Self-pay | Admitting: Nurse Practitioner

## 2022-01-16 NOTE — Telephone Encounter (Signed)
Per patient's request, dermatology referral faxed to [facility]; [phone]

## 2022-01-17 ENCOUNTER — Telehealth: Payer: Self-pay | Admitting: Nurse Practitioner

## 2022-01-17 NOTE — Telephone Encounter (Signed)
Dermatology referral faxed to [facility] per patient; [phone]

## 2022-01-18 ENCOUNTER — Telehealth: Payer: Self-pay | Admitting: Nurse Practitioner

## 2022-01-18 NOTE — Telephone Encounter (Signed)
Lvm to reschedule 02/17/22 appointment-Toni

## 2022-01-26 ENCOUNTER — Other Ambulatory Visit: Payer: Self-pay | Admitting: Internal Medicine

## 2022-01-26 DIAGNOSIS — M25561 Pain in right knee: Secondary | ICD-10-CM

## 2022-01-26 NOTE — Telephone Encounter (Signed)
Ortho referral sent 

## 2022-01-27 ENCOUNTER — Ambulatory Visit: Payer: BC Managed Care – PPO

## 2022-02-01 ENCOUNTER — Ambulatory Visit
Admission: EM | Admit: 2022-02-01 | Discharge: 2022-02-01 | Disposition: A | Discharge status: Home / Self Care | Payer: BC Managed Care – PPO | Attending: Emergency Medicine | Admitting: Emergency Medicine

## 2022-02-01 DIAGNOSIS — S46212A Strain of muscle, fascia and tendon of other parts of biceps, left arm, initial encounter: Secondary | ICD-10-CM

## 2022-02-01 MED ORDER — IBUPROFEN 600 MG PO TABS: 600.0000 mg | ORAL_TABLET | Freq: Four times a day (QID) | ORAL | 0 refills | Status: DC | PRN

## 2022-02-01 NOTE — ED Provider Notes (Signed)
[facility]    CSN: [id number] Arrival date & time: 02/01/22  1511      History   Chief Complaint Chief Complaint  Patient presents with   Arm Pain    HPI Joseph Ochoa is a 37 y.o. male.   HPI  37 year old male here for evaluation of left arm pain.  The patient reports that he was lifting a steel elevator door and around 1 PM this afternoon when he felt and heard a pop in his left upper arm.  This was immediately followed by pain.  He states the pain radiates down into his fingers and he does have numbness at times.  He states that he is able to move his arm but certain actions cause more pain.  Particular when he reaches out in front of him, like to shake hands, that increases the pain.  Past Medical History:  Diagnosis Date   Anxiety    Lyme disease     Patient Active Problem List   Diagnosis Date Noted   Osteoarthritis of spine with radiculopathy, cervical region 05/20/2021   Muscle ache 12/31/2020   Shoulder pain 12/31/2020   History of ADHD 02/06/2020   Erectile dysfunction 02/06/2020   Obesity 02/06/2020   Generalized anxiety disorder 10/22/2018   Lateral epicondylitis, right elbow 03/30/2018    Past Surgical History:  Procedure Laterality Date   INGUINAL HERNIA REPAIR Left    LYMPHANGIOMA EXCISION Right 2001   on right shoulder       Home Medications    Prior to Admission medications   Medication Sig Start Date End Date Taking? Authorizing Provider  amphetamine-dextroamphetamine (ADDERALL XR) 15 MG 24 hr capsule Take 1 capsule by mouth every morning. 01/13/22  Yes Abernathy, Yetta Flock, NP  gabapentin (NEURONTIN) 100 MG capsule Take 1-3 capsules nightly 11/28/21  Yes [provider]  ibuprofen (ADVIL) 600 MG tablet Take 1 tablet (600 mg total) by mouth every 6 (six) hours as needed. 02/01/22  Yes Margarette Canada, NP  sildenafil (VIAGRA) 100 MG tablet TAKE 1/2 TO 1 TABLET(50 TO 100 MG) BY MOUTH DAILY AS NEEDED FOR ERECTILE DYSFUNCTION  03/10/21  Yes Jon Billings, NP  valACYclovir (VALTREX) 1000 MG tablet TAKE 2 TABLETS BY MOUTH EVERY 12 HOURS FOR A TOTAL OF 2 DOSES 11/18/21  Yes Jon Billings, NP    Family History Family History  Problem Relation Age of Onset   Hyperlipidemia Mother    Epilepsy Mother    Hypertension Father    Prostate cancer Father    Diabetes Maternal Grandfather    Glaucoma Maternal Grandfather    Arthritis Maternal Grandfather    Hypertension Maternal Grandfather    Prostate cancer Maternal Grandfather    Hypertension Paternal Grandfather    Prostate cancer Paternal Grandfather     Social History Social History   Tobacco Use   Smoking status: Former    Types: Cigarettes    Quit date: 06/30/2016    Years since quitting: 5.5   Smokeless tobacco: Never  Vaping Use   Vaping Use: Never used  Substance Use Topics   Alcohol use: Yes    Comment: socially   Drug use: Never     Allergies   Patient has no known allergies.   Review of Systems Review of Systems  Musculoskeletal:  Positive for myalgias. Negative for arthralgias.  Skin:  Negative for color change.  Neurological:  Positive for numbness. Negative for weakness.  Hematological: Negative.   Psychiatric/Behavioral: Negative.  Physical Exam Triage Vital Signs ED Triage Vitals  Enc Vitals Group     BP 02/01/22 1637 (!) 153/81     Pulse Rate 02/01/22 1637 96     Resp 02/01/22 1637 16     Temp 02/01/22 1637 97.9 F (36.6 C)     Temp Source 02/01/22 1637 Oral     SpO2 02/01/22 1637 96 %     Weight 02/01/22 1636 200 lb (90.7 kg)     Height 02/01/22 1636 '5\' 9"'$  (1.753 m)     Head Circumference --      Peak Flow --      Pain Score 02/01/22 1636 4     Pain Loc --      Pain Edu? --      Excl. in [place]? --    No data found.  Updated Vital Signs BP (!) 153/81 (BP Location: Right Arm)   Pulse 96   Temp 97.9 F (36.6 C) (Oral)   Resp 16   Ht '5\' 9"'$  (1.753 m)   Wt 200 lb (90.7 kg)   SpO2 96%   BMI 29.53 kg/m    Visual Acuity Right Eye Distance:   Left Eye Distance:   Bilateral Distance:    Right Eye Near:   Left Eye Near:    Bilateral Near:     Physical Exam Vitals and nursing note reviewed.  Constitutional:      Appearance: Normal appearance.  Musculoskeletal:        General: Tenderness and signs of injury present. No swelling or deformity.     Comments: Bicep muscle groups appear symmetrical bilaterally.  There is tenderness at the superior medial aspect of the bicep muscle.  No palpable defect in the muscle or spasm.  No overlying ecchymosis.  Patient has 5/5 grip strength in left hand as well as 5/5 resisted flexion and extension of the left forearm.  Resisted flexion exacerbates pain but resisted extension does not exacerbate pain  Skin:    General: Skin is warm and dry.     Capillary Refill: Capillary refill takes less than 2 seconds.     Findings: No bruising or erythema.  Neurological:     General: No focal deficit present.     Mental Status: He is alert and oriented to person, place, and time.  Psychiatric:        Mood and Affect: Mood normal.        Behavior: Behavior normal.        Thought Content: Thought content normal.        Judgment: Judgment normal.      UC Treatments / Results  Labs (all labs ordered are listed, but only abnormal results are displayed) Labs Reviewed - No data to display  EKG   Radiology No results found.  Procedures Procedures (including critical care time)  Medications Ordered in UC Medications - No data to display  Initial Impression / Assessment and Plan / UC Course  I have reviewed the triage vital signs and the nursing notes.  Pertinent labs & imaging results that were available during my care of the patient were reviewed by me and considered in my medical decision making (see chart for details).   Patient is a pleasant, nontoxic-appearing 37 year old male here for evaluation of pain in the upper inner aspect of his left  bicep that started at 1:00 this afternoon after he lifted a heavy metal elevator door.  On exam patient's bicep muscle groups appear symmetrical.  There  is no appreciable ecchymosis, erythema, or edema.  Patient has pain with range of motion but he is able to extend his left arm, abducted to 90 degrees, and internally rotated to scratch his back.  Radial and ulnar pulses are 2+ and his cap refills less than 3 seconds.  Grip strength in left hand is 5/5 and he has 5/5 resisted flexion and extension of the forearm.  Resisted flexion does exacerbate his pain as does abduction of the arm.  He is able to abduct his shoulder to 90 degrees before causing pain.  There is no ecchymosis or erythema and there is no palpable muscle defect or spasm.  I suspect the patient has strained his bicep muscle.  I will put him in a sling and have him rest his arm for the next couple of days.  I have prescribed 60 mg ibuprofen that he can take every 6 hours to help with pain and inflammation and have also discussed applying ice to his upper arm for torments of time 2-3 times a day.  With each ice application he should remove the sling and put his shoulder through range of motion exercises to maintain mobility.  I have also referred him to orthopedics for further evaluation if his pain does not improve.  There is a possibility that he could have a tear in his muscle but I am unable to obtain imaging to rule that out here in the urgent care.  Work note provided.   Final Clinical Impressions(s) / UC Diagnoses   Final diagnoses:  Strain of left biceps, initial encounter     Discharge Instructions      Wear your sling during the day to rest your arm and take it off at bedtime.  Take the Ibuprofen, 600 mg every 6 hours, as needed for pain.  Apply ice to your arm for 20 minutes at a time, 2-3 times a day. Do range-of-motion 2-3 times a day as well to maintain shoulder mobility.  I have referred you to orthopedics for further  evaluation.     ED Prescriptions     Medication Sig Dispense Auth. Provider   ibuprofen (ADVIL) 600 MG tablet Take 1 tablet (600 mg total) by mouth every 6 (six) hours as needed. 30 tablet Margarette Canada, NP      PDMP not reviewed this encounter.   Margarette Canada, NP 02/01/22 1718

## 2022-02-01 NOTE — ED Triage Notes (Signed)
Pt c/o L arm pain ongoing since 1pm today, was picking up something heavy & heard a pop. Limited ROM & pain radiates down arm to fingers which cause numbness at times.

## 2022-02-01 NOTE — Discharge Instructions (Addendum)
Wear your sling during the day to rest your arm and take it off at bedtime.  Take the Ibuprofen, 600 mg every 6 hours, as needed for pain.  Apply ice to your arm for 20 minutes at a time, 2-3 times a day. Do range-of-motion 2-3 times a day as well to maintain shoulder mobility.  I have referred you to orthopedics for further evaluation.

## 2022-02-02 ENCOUNTER — Telehealth: Payer: Self-pay | Admitting: Nurse Practitioner

## 2022-02-02 DIAGNOSIS — M25512 Pain in left shoulder: Secondary | ICD-10-CM | POA: Diagnosis not present

## 2022-02-02 NOTE — Telephone Encounter (Signed)
Per patient's request, orthopedic referral sent via [facility] to [facility] in [place]

## 2022-02-03 ENCOUNTER — Telehealth: Payer: Self-pay | Admitting: Nurse Practitioner

## 2022-02-03 DIAGNOSIS — M25512 Pain in left shoulder: Secondary | ICD-10-CM | POA: Diagnosis not present

## 2022-02-03 NOTE — Telephone Encounter (Signed)
Per Rudene Christians, referral has been closed due to patient declining  to schedule appointment-Toni

## 2022-02-08 ENCOUNTER — Encounter: Payer: Self-pay | Admitting: Nurse Practitioner

## 2022-02-08 ENCOUNTER — Ambulatory Visit (INDEPENDENT_AMBULATORY_CARE_PROVIDER_SITE_OTHER): Payer: BC Managed Care – PPO | Admitting: Nurse Practitioner

## 2022-02-08 VITALS — BP 133/85 | HR 81 | Temp 97.1°F | Resp 16 | Ht 70.0 in | Wt 216.8 lb

## 2022-02-08 DIAGNOSIS — M064 Inflammatory polyarthropathy: Secondary | ICD-10-CM

## 2022-02-08 DIAGNOSIS — F902 Attention-deficit hyperactivity disorder, combined type: Secondary | ICD-10-CM

## 2022-02-08 MED ORDER — AMPHETAMINE-DEXTROAMPHET ER 15 MG PO CP24: 15.0000 mg | ORAL_CAPSULE | ORAL | 0 refills | Status: DC

## 2022-02-08 MED ORDER — AMPHETAMINE-DEXTROAMPHETAMINE 10 MG PO TABS: 10.0000 mg | ORAL_TABLET | Freq: Every day | ORAL | 0 refills | Status: DC

## 2022-02-08 NOTE — Progress Notes (Signed)
[facility] [place], [place] [address]  Internal MEDICINE  Office Visit Note  Patient Name: Joseph Ochoa  [id number]  [id number]  Date of Service: 02/08/2022  Chief Complaint  Patient presents with   Follow-up    HPI Joseph Ochoa presents for a follow-up visit for ADHD, joint pains, torn bicep, urology consult and labs for bilirubin ADHD -- was started on adderall XR 15 mg daily at last office visit. Takes med around 5 am and it wears off around noon.  Torn bicep tendon --seeing [facility]  Having pain of multiple joints including elbows, knees, fingers, wrists, neck and jaw. Worried it could be something more serious that OA.  Hyperbilirubinemia -- has not had labs drawn, reminded to get them drawn soon Will call urology to schedule with Joseph Skillern PA-C   Current Medication: Outpatient Encounter Medications as of 02/08/2022  Medication Sig   gabapentin (NEURONTIN) 100 MG capsule Take 1-3 capsules nightly   ibuprofen (ADVIL) 600 MG tablet Take 1 tablet (600 mg total) by mouth every 6 (six) hours as needed.   sildenafil (VIAGRA) 100 MG tablet TAKE 1/2 TO 1 TABLET(50 TO 100 MG) BY MOUTH DAILY AS NEEDED FOR ERECTILE DYSFUNCTION   valACYclovir (VALTREX) 1000 MG tablet TAKE 2 TABLETS BY MOUTH EVERY 12 HOURS FOR A TOTAL OF 2 DOSES   [DISCONTINUED] amphetamine-dextroamphetamine (ADDERALL XR) 15 MG 24 hr capsule Take 1 capsule by mouth every morning.   amphetamine-dextroamphetamine (ADDERALL XR) 15 MG 24 hr capsule Take 1 capsule by mouth every morning.   amphetamine-dextroamphetamine (ADDERALL) 10 MG tablet Take 1 tablet (10 mg total) by mouth daily at 12 noon.   No facility-administered encounter medications on file as of 02/08/2022.    Surgical History: Past Surgical History:  Procedure Laterality Date   INGUINAL HERNIA REPAIR Left    LYMPHANGIOMA EXCISION Right 2001   on right shoulder    Medical History: Past Medical History:  Diagnosis  Date   Anxiety    Lyme disease     Family History: Family History  Problem Relation Age of Onset   Hyperlipidemia Mother    Epilepsy Mother    Hypertension Father    Prostate cancer Father    Diabetes Maternal Grandfather    Glaucoma Maternal Grandfather    Arthritis Maternal Grandfather    Hypertension Maternal Grandfather    Prostate cancer Maternal Grandfather    Hypertension Paternal Grandfather    Prostate cancer Paternal Grandfather     Social History   Socioeconomic History   Marital status: Married    Spouse name: Not on file   Number of children: Not on file   Years of education: Not on file   Highest education level: Not on file  Occupational History   Not on file  Tobacco Use   Smoking status: Former    Types: Cigarettes    Quit date: 06/30/2016    Years since quitting: 5.6   Smokeless tobacco: Never  Vaping Use   Vaping Use: Never used  Substance and Sexual Activity   Alcohol use: Yes    Comment: socially   Drug use: Never   Sexual activity: Yes  Other Topics Concern   Not on file  Social History Narrative   Not on file   Social Determinants of Health   Financial Resource Strain: Not on file  Food Insecurity: Not on file  Transportation Needs: Not on file  Physical Activity: Not on file  Stress: Not on file  Social Connections:  Not on file  Intimate Partner Violence: Not on file      Review of Systems  Constitutional:  Negative for activity change, appetite change, chills, fatigue, fever and unexpected weight change.  HENT: Negative.  Negative for congestion, ear pain, rhinorrhea, sore throat and trouble swallowing.   Eyes: Negative.   Respiratory: Negative.  Negative for cough, chest tightness, shortness of breath and wheezing.   Cardiovascular: Negative.  Negative for chest pain and palpitations.  Gastrointestinal: Negative.  Negative for abdominal pain, blood in stool, constipation, diarrhea, nausea and vomiting.  Endocrine: Negative.    Genitourinary: Negative.  Negative for difficulty urinating, dysuria, frequency, hematuria and urgency.  Musculoskeletal: Negative.  Negative for arthralgias, back pain, joint swelling, myalgias and neck pain.  Skin: Negative.  Negative for rash and wound.  Allergic/Immunologic: Negative.  Negative for immunocompromised state.  Neurological: Negative.  Negative for dizziness, seizures, numbness and headaches.  Hematological: Negative.   Psychiatric/Behavioral:  Positive for decreased concentration and sleep disturbance. Negative for behavioral problems, self-injury and suicidal ideas. The patient is nervous/anxious.     Vital Signs: BP 133/85   Pulse 81   Temp (!) 97.1 F (36.2 C)   Resp 16   Ht '5\' 10"'$  (1.778 m)   Wt 216 lb 12.8 oz (98.3 kg)   SpO2 98%   BMI 31.11 kg/m    Physical Exam Vitals reviewed.  Constitutional:      Appearance: Normal appearance. He is obese.  HENT:     Head: Normocephalic and atraumatic.  Eyes:     Pupils: Pupils are equal, round, and reactive to light.  Cardiovascular:     Rate and Rhythm: Normal rate and regular rhythm.  Pulmonary:     Effort: Pulmonary effort is normal. No respiratory distress.  Neurological:     Mental Status: He is alert and oriented to person, place, and time.  Psychiatric:        Mood and Affect: Mood normal.        Behavior: Behavior normal.        Assessment/Plan: 1. Inflammatory polyarthritis ([place]) Additional labs ordered to rule out autoimmune/inflammatory arthritis  - ANA Direct w/Reflex if Positive - Rheumatoid Factor - Sed Rate (ESR) - C-reactive protein  2. Hyperbilirubinemia of non-newborn patient Reminded patient to have labs drawn  3. ADHD (attention deficit hyperactivity disorder), combined type Added a dose of immediate release adderall to take around noon daily. Follow up in 4 weeks to reevaluate.  - amphetamine-dextroamphetamine (ADDERALL) 10 MG tablet; Take 1 tablet (10 mg total) by mouth  daily at 12 noon.  Dispense: 30 tablet; Refill: 0 - amphetamine-dextroamphetamine (ADDERALL XR) 15 MG 24 hr capsule; Take 1 capsule by mouth every morning.  Dispense: 30 capsule; Refill: 0   General Counseling: Joseph Ochoa understanding of the findings of todays visit and agrees with plan of treatment. I have discussed any further diagnostic evaluation that may be needed or ordered today. We also reviewed his medications today. he has been encouraged to call the office with any questions or concerns that should arise related to todays visit.    Orders Placed This Encounter  Procedures   ANA Direct w/Reflex if Positive   Rheumatoid Factor   Sed Rate (ESR)   C-reactive protein    Meds ordered this encounter  Medications   amphetamine-dextroamphetamine (ADDERALL) 10 MG tablet    Sig: Take 1 tablet (10 mg total) by mouth daily at 12 noon.    Dispense:  30 tablet  Refill:  0    This dose is in additional to the adderal XR dose.   amphetamine-dextroamphetamine (ADDERALL XR) 15 MG 24 hr capsule    Sig: Take 1 capsule by mouth every morning.    Dispense:  30 capsule    Refill:  0    Return in about 4 weeks (around 03/08/2022) for F/U, eval new med, [place] PCP.   Total time spent:30 Minutes Time spent includes review of chart, medications, test results, and follow up plan with the patient.   [place] Controlled Substance Database was reviewed by me.  This patient was seen by Jonetta Osgood, FNP-C in collaboration with Dr. Clayborn Bigness as a part of collaborative care agreement.   Rainelle Sulewski R. Valetta Fuller, MSN, FNP-C Internal medicine

## 2022-02-17 ENCOUNTER — Ambulatory Visit: Payer: BC Managed Care – PPO | Admitting: Nurse Practitioner

## 2022-02-24 ENCOUNTER — Encounter: Payer: BC Managed Care – PPO | Admitting: Nurse Practitioner

## 2022-03-15 DIAGNOSIS — M064 Inflammatory polyarthropathy: Secondary | ICD-10-CM | POA: Diagnosis not present

## 2022-03-16 LAB — C-REACTIVE PROTEIN: CRP: <1 mg/L (ref 0–10)

## 2022-03-16 LAB — RHEUMATOID FACTOR: Rheumatoid fact SerPl-aCnc: <10 IU/mL (ref <14.0)

## 2022-03-16 LAB — ANA W/REFLEX IF POSITIVE: Anti Nuclear Antibody (ANA): NEGATIVE

## 2022-03-16 LAB — GAMMA GT: GGT: 24 IU/L (ref 0–65)

## 2022-03-16 LAB — BILIRUBIN, FRACTIONATED(TOT/DIR/INDIR)
Bilirubin Total: 0.8 mg/dL (ref 0.0–1.2)
Bilirubin, Direct: 0.24 mg/dL (ref 0.00–0.40)
Bilirubin, Indirect: 0.56 mg/dL (ref 0.10–0.80)

## 2022-03-16 LAB — SEDIMENTATION RATE: Sed Rate: 2 mm/hr (ref 0–15)

## 2022-03-17 ENCOUNTER — Encounter: Payer: Self-pay | Admitting: Nurse Practitioner

## 2022-03-17 ENCOUNTER — Ambulatory Visit (INDEPENDENT_AMBULATORY_CARE_PROVIDER_SITE_OTHER): Payer: BC Managed Care – PPO | Admitting: Nurse Practitioner

## 2022-03-17 VITALS — BP 130/88 | HR 82 | Temp 97.6°F | Resp 16 | Ht 70.0 in | Wt 210.2 lb

## 2022-03-17 DIAGNOSIS — F902 Attention-deficit hyperactivity disorder, combined type: Secondary | ICD-10-CM

## 2022-03-17 DIAGNOSIS — R1031 Right lower quadrant pain: Secondary | ICD-10-CM

## 2022-03-17 DIAGNOSIS — Z1339 Encounter for screening examination for other mental health and behavioral disorders: Secondary | ICD-10-CM

## 2022-03-17 DIAGNOSIS — B009 Herpesviral infection, unspecified: Secondary | ICD-10-CM

## 2022-03-17 DIAGNOSIS — Z9889 Other specified postprocedural states: Secondary | ICD-10-CM

## 2022-03-17 MED ORDER — AMPHETAMINE-DEXTROAMPHET ER 15 MG PO CP24: 15.0000 mg | ORAL_CAPSULE | ORAL | 0 refills | Status: DC

## 2022-03-17 MED ORDER — METHOCARBAMOL 500 MG PO TABS: 500.0000 mg | ORAL_TABLET | Freq: Three times a day (TID) | ORAL | 1 refills | Status: DC | PRN

## 2022-03-17 MED ORDER — AMPHETAMINE-DEXTROAMPHETAMINE 10 MG PO TABS: 10.0000 mg | ORAL_TABLET | Freq: Every day | ORAL | 0 refills | Status: DC

## 2022-03-17 MED ORDER — VALACYCLOVIR HCL 1 G PO TABS: ORAL_TABLET | ORAL | 1 refills | Status: AC

## 2022-03-17 NOTE — Progress Notes (Cosign Needed Addendum)
[facility] [place], [place] [address]  Internal MEDICINE  Office Visit Note  Patient Name: Joseph Ochoa  [id number]  [id number]  Date of Service: 03/17/2022  Chief Complaint  Patient presents with   Follow-up    HPI Sabrina presents for a follow-up visit for ADHD Adderall dose is working better, wants to continue current dose Had surgery for biceps tear, healing and in PT Pain in right thigh/groin --burning muscle/tendon? Needs refill of valacyclovir     Current Medication: Outpatient Encounter Medications as of 03/17/2022  Medication Sig   gabapentin (NEURONTIN) 100 MG capsule Take 1-3 capsules nightly   ibuprofen (ADVIL) 600 MG tablet Take 1 tablet (600 mg total) by mouth every 6 (six) hours as needed.   methocarbamol (ROBAXIN) 500 MG tablet Take 1 tablet (500 mg total) by mouth every 8 (eight) hours as needed for muscle spasms.   sildenafil (VIAGRA) 100 MG tablet TAKE 1/2 TO 1 TABLET(50 TO 100 MG) BY MOUTH DAILY AS NEEDED FOR ERECTILE DYSFUNCTION   [DISCONTINUED] amphetamine-dextroamphetamine (ADDERALL XR) 15 MG 24 hr capsule Take 1 capsule by mouth every morning.   [DISCONTINUED] amphetamine-dextroamphetamine (ADDERALL) 10 MG tablet Take 1 tablet (10 mg total) by mouth daily at 12 noon.   [DISCONTINUED] valACYclovir (VALTREX) 1000 MG tablet TAKE 2 TABLETS BY MOUTH EVERY 12 HOURS FOR A TOTAL OF 2 DOSES   amphetamine-dextroamphetamine (ADDERALL XR) 15 MG 24 hr capsule Take 1 capsule by mouth every morning.   [START ON 04/14/2022] amphetamine-dextroamphetamine (ADDERALL XR) 15 MG 24 hr capsule Take 1 capsule by mouth every morning.   [START ON 05/12/2022] amphetamine-dextroamphetamine (ADDERALL XR) 15 MG 24 hr capsule Take 1 capsule by mouth every morning.   amphetamine-dextroamphetamine (ADDERALL) 10 MG tablet Take 1 tablet (10 mg total) by mouth daily at 12 noon.   [START ON 04/14/2022] amphetamine-dextroamphetamine (ADDERALL) 10 MG tablet Take 1  tablet (10 mg total) by mouth daily at 12 noon.   [START ON 05/12/2022] amphetamine-dextroamphetamine (ADDERALL) 10 MG tablet Take 1 tablet (10 mg total) by mouth daily at 12 noon.   valACYclovir (VALTREX) 1000 MG tablet TAKE 2 TABLETS BY MOUTH EVERY 12 HOURS during outbreak   No facility-administered encounter medications on file as of 03/17/2022.    Surgical History: Past Surgical History:  Procedure Laterality Date   INGUINAL HERNIA REPAIR Left    LYMPHANGIOMA EXCISION Right 2001   on right shoulder    Medical History: Past Medical History:  Diagnosis Date   Anxiety    Lyme disease     Family History: Family History  Problem Relation Age of Onset   Hyperlipidemia Mother    Epilepsy Mother    Hypertension Father    Prostate cancer Father    Diabetes Maternal Grandfather    Glaucoma Maternal Grandfather    Arthritis Maternal Grandfather    Hypertension Maternal Grandfather    Prostate cancer Maternal Grandfather    Hypertension Paternal Grandfather    Prostate cancer Paternal Grandfather     Social History   Socioeconomic History   Marital status: Married    Spouse name: Not on file   Number of children: Not on file   Years of education: Not on file   Highest education level: Not on file  Occupational History   Not on file  Tobacco Use   Smoking status: Former    Types: Cigarettes    Quit date: 06/30/2016    Years since quitting: 5.7   Smokeless tobacco: Never  Vaping  Use   Vaping Use: Never used  Substance and Sexual Activity   Alcohol use: Yes    Comment: socially   Drug use: Never   Sexual activity: Yes  Other Topics Concern   Not on file  Social History Narrative   Not on file   Social Determinants of Health   Financial Resource Strain: Not on file  Food Insecurity: Not on file  Transportation Needs: Not on file  Physical Activity: Not on file  Stress: Not on file  Social Connections: Not on file  Intimate Partner Violence: Not on file       Review of Systems  Constitutional:  Negative for activity change, appetite change, chills, fatigue, fever and unexpected weight change.  HENT: Negative.  Negative for congestion, ear pain, rhinorrhea, sore throat and trouble swallowing.   Eyes: Negative.   Respiratory: Negative.  Negative for cough, chest tightness, shortness of breath and wheezing.   Cardiovascular: Negative.  Negative for chest pain and palpitations.  Gastrointestinal: Negative.  Negative for abdominal pain, blood in stool, constipation, diarrhea, nausea and vomiting.  Endocrine: Negative.   Genitourinary: Negative.  Negative for difficulty urinating, dysuria, frequency, hematuria and urgency.  Musculoskeletal: Negative.  Negative for arthralgias, back pain, joint swelling, myalgias and neck pain.  Skin: Negative.  Negative for rash and wound.  Allergic/Immunologic: Negative.  Negative for immunocompromised state.  Neurological: Negative.  Negative for dizziness, seizures, numbness and headaches.  Hematological: Negative.   Psychiatric/Behavioral:  Positive for decreased concentration. Negative for behavioral problems, self-injury, sleep disturbance and suicidal ideas. The patient is nervous/anxious.     Vital Signs: BP 130/88 Comment: 137/95  Pulse 82   Temp 97.6 F (36.4 C)   Resp 16   Ht '5\' 10"'$  (1.778 m)   Wt 210 lb 3.2 oz (95.3 kg)   SpO2 98%   BMI 30.16 kg/m    Physical Exam Vitals reviewed.  Constitutional:      General: He is not in acute distress.    Appearance: Normal appearance. He is obese. He is not ill-appearing.  HENT:     Head: Normocephalic and atraumatic.  Eyes:     Pupils: Pupils are equal, round, and reactive to light.  Cardiovascular:     Rate and Rhythm: Normal rate and regular rhythm.  Pulmonary:     Effort: Pulmonary effort is normal. No respiratory distress.  Neurological:     Mental Status: He is alert and oriented to person, place, and time.  Psychiatric:        Mood  and Affect: Mood normal.        Behavior: Behavior normal.        Assessment/Plan: 1. Unilateral groin pain, right Mild to moderate muscle pain in the groin/upper thigh. Muscle relaxer prescribed. Follow up as needed.  - methocarbamol (ROBAXIN) 500 MG tablet; Take 1 tablet (500 mg total) by mouth every 8 (eight) hours as needed for muscle spasms.  Dispense: 60 tablet; Refill: 1  2. Herpes simplex Refills ordered. Continue valacyclovir as prescribed.  - valACYclovir (VALTREX) 1000 MG tablet; TAKE 2 TABLETS BY MOUTH EVERY 12 HOURS during outbreak  Dispense: 60 tablet; Refill: 1  3. ADHD (attention deficit hyperactivity disorder), combined type Continue adderall as prescribed. Refills ordered x3 months, follow up in 3 months for additional refills.  - amphetamine-dextroamphetamine (ADDERALL) 10 MG tablet; Take 1 tablet (10 mg total) by mouth daily at 12 noon.  Dispense: 30 tablet; Refill: 0 - amphetamine-dextroamphetamine (ADDERALL XR) 15 MG 24 hr  capsule; Take 1 capsule by mouth every morning.  Dispense: 30 capsule; Refill: 0 - amphetamine-dextroamphetamine (ADDERALL) 10 MG tablet; Take 1 tablet (10 mg total) by mouth daily at 12 noon.  Dispense: 30 tablet; Refill: 0 - amphetamine-dextroamphetamine (ADDERALL) 10 MG tablet; Take 1 tablet (10 mg total) by mouth daily at 12 noon.  Dispense: 30 tablet; Refill: 0 - amphetamine-dextroamphetamine (ADDERALL XR) 15 MG 24 hr capsule; Take 1 capsule by mouth every morning.  Dispense: 30 capsule; Refill: 0 - amphetamine-dextroamphetamine (ADDERALL XR) 15 MG 24 hr capsule; Take 1 capsule by mouth every morning.  Dispense: 30 capsule; Refill: 0   General Counseling: shondale slavinski understanding of the findings of todays visit and agrees with plan of treatment. I have discussed any further diagnostic evaluation that may be needed or ordered today. We also reviewed his medications today. he has been encouraged to call the office with any questions or  concerns that should arise related to todays visit.    No orders of the defined types were placed in this encounter.   Meds ordered this encounter  Medications   methocarbamol (ROBAXIN) 500 MG tablet    Sig: Take 1 tablet (500 mg total) by mouth every 8 (eight) hours as needed for muscle spasms.    Dispense:  60 tablet    Refill:  1    Fill today.   amphetamine-dextroamphetamine (ADDERALL) 10 MG tablet    Sig: Take 1 tablet (10 mg total) by mouth daily at 12 noon.    Dispense:  30 tablet    Refill:  0    This dose is in additional to the adderal XR dose.   amphetamine-dextroamphetamine (ADDERALL XR) 15 MG 24 hr capsule    Sig: Take 1 capsule by mouth every morning.    Dispense:  30 capsule    Refill:  0   amphetamine-dextroamphetamine (ADDERALL) 10 MG tablet    Sig: Take 1 tablet (10 mg total) by mouth daily at 12 noon.    Dispense:  30 tablet    Refill:  0    This dose is in additional to the adderal XR dose.   amphetamine-dextroamphetamine (ADDERALL) 10 MG tablet    Sig: Take 1 tablet (10 mg total) by mouth daily at 12 noon.    Dispense:  30 tablet    Refill:  0    This dose is in additional to the adderal XR dose.   amphetamine-dextroamphetamine (ADDERALL XR) 15 MG 24 hr capsule    Sig: Take 1 capsule by mouth every morning.    Dispense:  30 capsule    Refill:  0    Fill for march   amphetamine-dextroamphetamine (ADDERALL XR) 15 MG 24 hr capsule    Sig: Take 1 capsule by mouth every morning.    Dispense:  30 capsule    Refill:  0    Fill for april   valACYclovir (VALTREX) 1000 MG tablet    Sig: TAKE 2 TABLETS BY MOUTH EVERY 12 HOURS during outbreak    Dispense:  60 tablet    Refill:  1    Return in about 3 months (around 06/08/2022) for F/U, ADHD med check, Edie Darley PCP.   Total time spent:30 Minutes Time spent includes review of chart, medications, test results, and follow up plan with the patient.   [place] Controlled Substance Database was reviewed by me.  This  patient was seen by Jonetta Osgood, FNP-C in collaboration with Dr. Clayborn Bigness as a part of collaborative care  agreement.   Kobie Matkins R. Valetta Fuller, MSN, FNP-C Internal medicine

## 2022-03-26 ENCOUNTER — Encounter: Payer: Self-pay | Admitting: Nurse Practitioner

## 2022-05-05 ENCOUNTER — Other Ambulatory Visit: Payer: Self-pay | Admitting: Nurse Practitioner

## 2022-05-05 NOTE — Telephone Encounter (Signed)
Requested medications are due for refill today.  unsure  Requested medications are on the active medications list.  yes  Last refill. 03/10/2021 #5 11 refills  Future visit scheduled.   no  Notes to clinic.  Sallyanne Kuster listed as PCP.    Requested Prescriptions  Pending Prescriptions Disp Refills   sildenafil (VIAGRA) 100 MG tablet [Pharmacy Med Name: SILDENAFIL 100MG  TABLETS] 5 tablet 11    Sig: TAKE 1/2 TO 1 TABLET(50 TO 100 MG) BY MOUTH DAILY AS NEEDED FOR ERECTILE DYSFUNCTION     Urology: Erectile Dysfunction Agents Passed - 05/05/2022  9:16 AM      Passed - AST in normal range and within 360 days    AST  Date Value Ref Range Status  01/12/2022 29 0 - 40 IU/L Final         Passed - ALT in normal range and within 360 days    ALT  Date Value Ref Range Status  01/12/2022 29 0 - 44 IU/L Final         Passed - Last BP in normal range    BP Readings from Last 1 Encounters:  03/17/22 130/88         Passed - Valid encounter within last 12 months    Recent Outpatient Visits           8 months ago Osteoarthritis of spine with radiculopathy, cervical region   [facility] Larae Grooms, NP   10 months ago Testicular swelling   [facility] Larae Grooms, NP   11 months ago Osteoarthritis of spine with radiculopathy, cervical region   [facility] [place], [place], DO   1 year ago Acute pain of left shoulder   [facility] [place], Megan P, DO   1 year ago Muscle ache   [facility] Loura Pardon, MD

## 2022-05-26 ENCOUNTER — Ambulatory Visit: Payer: BC Managed Care – PPO | Admitting: Nurse Practitioner

## 2022-05-26 ENCOUNTER — Encounter: Payer: Self-pay | Admitting: Nurse Practitioner

## 2022-05-26 VITALS — BP 115/85 | HR 98 | Temp 97.6°F | Resp 16 | Ht 70.0 in | Wt 209.8 lb

## 2022-05-26 DIAGNOSIS — Z9109 Other allergy status, other than to drugs and biological substances: Secondary | ICD-10-CM | POA: Diagnosis not present

## 2022-05-26 DIAGNOSIS — F902 Attention-deficit hyperactivity disorder, combined type: Secondary | ICD-10-CM | POA: Diagnosis not present

## 2022-05-26 DIAGNOSIS — N529 Male erectile dysfunction, unspecified: Secondary | ICD-10-CM

## 2022-05-26 DIAGNOSIS — R1031 Right lower quadrant pain: Secondary | ICD-10-CM | POA: Diagnosis not present

## 2022-05-26 MED ORDER — MELOXICAM 15 MG PO TABS: 15.0000 mg | ORAL_TABLET | Freq: Every day | ORAL | 1 refills | Status: DC

## 2022-05-26 MED ORDER — AMPHETAMINE-DEXTROAMPHET ER 15 MG PO CP24
15.0000 mg | ORAL_CAPSULE | ORAL | 0 refills | Status: DC
Start: 2022-07-21 — End: 2022-08-11

## 2022-05-26 MED ORDER — AMPHETAMINE-DEXTROAMPHETAMINE 10 MG PO TABS: 10.0000 mg | ORAL_TABLET | Freq: Every day | ORAL | 0 refills | Status: DC

## 2022-05-26 MED ORDER — CYCLOBENZAPRINE HCL 10 MG PO TABS: 10.0000 mg | ORAL_TABLET | Freq: Every day | ORAL | 1 refills | Status: DC

## 2022-05-26 MED ORDER — AMPHETAMINE-DEXTROAMPHET ER 15 MG PO CP24
15.0000 mg | ORAL_CAPSULE | ORAL | 0 refills | Status: DC
Start: 2022-05-26 — End: 2022-08-11

## 2022-05-26 MED ORDER — AMPHETAMINE-DEXTROAMPHET ER 15 MG PO CP24
15.0000 mg | ORAL_CAPSULE | ORAL | 0 refills | Status: DC
Start: 2022-06-23 — End: 2022-08-11

## 2022-05-26 MED ORDER — FEXOFENADINE-PSEUDOEPHED ER 60-120 MG PO TB12
1.0000 | ORAL_TABLET | Freq: Two times a day (BID) | ORAL | 2 refills | Status: DC
Start: 2022-05-26 — End: 2022-08-11

## 2022-05-26 MED ORDER — SILDENAFIL CITRATE 100 MG PO TABS: ORAL_TABLET | ORAL | 5 refills | Status: DC

## 2022-05-26 NOTE — Progress Notes (Signed)
[facility] [address] [place], [place] [address]  Internal MEDICINE  Office Visit Note  Patient Name: Joseph Ochoa  [id number]  [id number]  Date of Service: 05/26/2022  Chief Complaint  Patient presents with   Follow-up   ADHD    HPI [name] presents for a follow-up visit for ADHD, ED, allergies, and groin pain ADHD -- current dose is effective. BP and heart rate are normal. Denies any palpitations or other adverse reactions. Due for refills.  ED --due for refills of viagra Allergies -- takes allegra-D OTC, needs refill Groin pain -- possible muscle pain or hernia. Methocarbamol is not helping.    Current Medication: Outpatient Encounter Medications as of 05/26/2022  Medication Sig   cyclobenzaprine (FLEXERIL) 10 MG tablet Take 1 tablet (10 mg total) by mouth at bedtime.   fexofenadine-pseudoephedrine (ALLEGRA-D) 60-120 MG 12 hr tablet Take 1 tablet by mouth 2 (two) times daily.   gabapentin (NEURONTIN) 100 MG capsule Take 1-3 capsules nightly   ibuprofen (ADVIL) 600 MG tablet Take 1 tablet (600 mg total) by mouth every 6 (six) hours as needed.   meloxicam (MOBIC) 15 MG tablet Take 1 tablet (15 mg total) by mouth daily. In am with breakfast   valACYclovir (VALTREX) 1000 MG tablet TAKE 2 TABLETS BY MOUTH EVERY 12 HOURS during outbreak   [DISCONTINUED] amphetamine-dextroamphetamine (ADDERALL XR) 15 MG 24 hr capsule Take 1 capsule by mouth every morning.   [DISCONTINUED] amphetamine-dextroamphetamine (ADDERALL XR) 15 MG 24 hr capsule Take 1 capsule by mouth every morning.   [DISCONTINUED] amphetamine-dextroamphetamine (ADDERALL XR) 15 MG 24 hr capsule Take 1 capsule by mouth every morning.   [DISCONTINUED] amphetamine-dextroamphetamine (ADDERALL) 10 MG tablet Take 1 tablet (10 mg total) by mouth daily at 12 noon.   [DISCONTINUED] amphetamine-dextroamphetamine (ADDERALL) 10 MG tablet Take 1 tablet (10 mg total) by mouth daily at 12 noon.   [DISCONTINUED]  amphetamine-dextroamphetamine (ADDERALL) 10 MG tablet Take 1 tablet (10 mg total) by mouth daily at 12 noon.   [DISCONTINUED] methocarbamol (ROBAXIN) 500 MG tablet Take 1 tablet (500 mg total) by mouth every 8 (eight) hours as needed for muscle spasms.   [DISCONTINUED] sildenafil (VIAGRA) 100 MG tablet TAKE 1/2 TO 1 TABLET(50 TO 100 MG) BY MOUTH DAILY AS NEEDED FOR ERECTILE DYSFUNCTION   [START ON 07/21/2022] amphetamine-dextroamphetamine (ADDERALL XR) 15 MG 24 hr capsule Take 1 capsule by mouth every morning.   [START ON 06/23/2022] amphetamine-dextroamphetamine (ADDERALL XR) 15 MG 24 hr capsule Take 1 capsule by mouth every morning.   amphetamine-dextroamphetamine (ADDERALL XR) 15 MG 24 hr capsule Take 1 capsule by mouth every morning.   [START ON 07/21/2022] amphetamine-dextroamphetamine (ADDERALL) 10 MG tablet Take 1 tablet (10 mg total) by mouth daily at 12 noon.   [START ON 06/23/2022] amphetamine-dextroamphetamine (ADDERALL) 10 MG tablet Take 1 tablet (10 mg total) by mouth daily at 12 noon.   amphetamine-dextroamphetamine (ADDERALL) 10 MG tablet Take 1 tablet (10 mg total) by mouth daily at 12 noon.   sildenafil (VIAGRA) 100 MG tablet TAKE 1/2 TO 1 TABLET(50 TO 100 MG) BY MOUTH DAILY AS NEEDED FOR ERECTILE DYSFUNCTION   No facility-administered encounter medications on file as of 05/26/2022.    Surgical History: Past Surgical History:  Procedure Laterality Date   INGUINAL HERNIA REPAIR Left    LYMPHANGIOMA EXCISION Right 2001   on right shoulder    Medical History: Past Medical History:  Diagnosis Date   Anxiety    Lyme disease     Family History:  Family History  Problem Relation Age of Onset   Hyperlipidemia Mother    Epilepsy Mother    Hypertension Father    Prostate cancer Father    Diabetes Maternal Grandfather    Glaucoma Maternal Grandfather    Arthritis Maternal Grandfather    Hypertension Maternal Grandfather    Prostate cancer Maternal Grandfather    Hypertension  Paternal Grandfather    Prostate cancer Paternal Grandfather     Social History   Socioeconomic History   Marital status: Married    Spouse name: Not on file   Number of children: Not on file   Years of education: Not on file   Highest education level: Not on file  Occupational History   Not on file  Tobacco Use   Smoking status: Former    Types: Cigarettes    Quit date: 06/30/2016    Years since quitting: 5.9   Smokeless tobacco: Never  Vaping Use   Vaping Use: Never used  Substance and Sexual Activity   Alcohol use: Yes    Comment: socially   Drug use: Never   Sexual activity: Yes  Other Topics Concern   Not on file  Social History Narrative   Not on file   Social Determinants of Health   Financial Resource Strain: Not on file  Food Insecurity: Not on file  Transportation Needs: Not on file  Physical Activity: Not on file  Stress: Not on file  Social Connections: Not on file  Intimate Partner Violence: Not on file      Review of Systems  Constitutional:  Positive for fatigue. Negative for activity change, appetite change, chills, fever and unexpected weight change.  HENT: Negative.  Negative for congestion, ear pain, rhinorrhea, sore throat and trouble swallowing.   Eyes: Negative.   Respiratory: Negative.  Negative for cough, chest tightness, shortness of breath and wheezing.   Cardiovascular: Negative.  Negative for chest pain and palpitations.  Gastrointestinal: Negative.  Negative for abdominal pain, blood in stool, constipation, diarrhea, nausea and vomiting.  Endocrine: Negative.   Genitourinary: Negative.  Negative for difficulty urinating, dysuria, frequency, hematuria and urgency.  Musculoskeletal: Negative.  Negative for arthralgias, back pain, joint swelling, myalgias and neck pain.  Skin: Negative.  Negative for rash and wound.  Allergic/Immunologic: Negative.  Negative for immunocompromised state.  Neurological: Negative.  Negative for dizziness,  seizures, numbness and headaches.  Hematological: Negative.   Psychiatric/Behavioral:  Positive for decreased concentration. Negative for behavioral problems, self-injury, sleep disturbance and suicidal ideas. The patient is nervous/anxious.     Vital Signs: BP 115/85   Pulse 98   Temp 97.6 F (36.4 C)   Resp 16   Ht 5\' 10"  (1.778 m)   Wt 209 lb 12.8 oz (95.2 kg)   SpO2 99%   BMI 30.10 kg/m    Physical Exam Vitals reviewed.  Constitutional:      General: He is not in acute distress.    Appearance: Normal appearance. He is obese. He is not ill-appearing.  HENT:     Head: Normocephalic and atraumatic.  Eyes:     Pupils: Pupils are equal, round, and reactive to light.  Cardiovascular:     Rate and Rhythm: Normal rate and regular rhythm.  Pulmonary:     Effort: Pulmonary effort is normal. No respiratory distress.  Neurological:     Mental Status: He is alert and oriented to person, place, and time.  Psychiatric:        Mood and Affect: Mood  normal.        Behavior: Behavior normal.        Assessment/Plan: 1. Erectile dysfunction, unspecified erectile dysfunction type Continue viagra as prescribed.  - sildenafil (VIAGRA) 100 MG tablet; TAKE 1/2 TO 1 TABLET(50 TO 100 MG) BY MOUTH DAILY AS NEEDED FOR ERECTILE DYSFUNCTION  Dispense: 10 tablet; Refill: 5  2. Unilateral groin pain, right Meloxicam and cyclobenzaprine prescribed for musculoskeletal pain.  - meloxicam (MOBIC) 15 MG tablet; Take 1 tablet (15 mg total) by mouth daily. In am with breakfast  Dispense: 30 tablet; Refill: 1 - cyclobenzaprine (FLEXERIL) 10 MG tablet; Take 1 tablet (10 mg total) by mouth at bedtime.  Dispense: 30 tablet; Refill: 1  3. Multiple environmental allergies Take allegra-D as prescribed  - fexofenadine-pseudoephedrine (ALLEGRA-D) 60-120 MG 12 hr tablet; Take 1 tablet by mouth 2 (two) times daily.  Dispense: 60 tablet; Refill: 2  4. ADHD (attention deficit hyperactivity disorder), combined  type Continue adderall as prescribed. Follow up in 3 months for additional refills. Need UDS at next visit - amphetamine-dextroamphetamine (ADDERALL) 10 MG tablet; Take 1 tablet (10 mg total) by mouth daily at 12 noon.  Dispense: 30 tablet; Refill: 0 - amphetamine-dextroamphetamine (ADDERALL) 10 MG tablet; Take 1 tablet (10 mg total) by mouth daily at 12 noon.  Dispense: 30 tablet; Refill: 0 - amphetamine-dextroamphetamine (ADDERALL) 10 MG tablet; Take 1 tablet (10 mg total) by mouth daily at 12 noon.  Dispense: 30 tablet; Refill: 0 - amphetamine-dextroamphetamine (ADDERALL XR) 15 MG 24 hr capsule; Take 1 capsule by mouth every morning.  Dispense: 30 capsule; Refill: 0 - amphetamine-dextroamphetamine (ADDERALL XR) 15 MG 24 hr capsule; Take 1 capsule by mouth every morning.  Dispense: 30 capsule; Refill: 0 - amphetamine-dextroamphetamine (ADDERALL XR) 15 MG 24 hr capsule; Take 1 capsule by mouth every morning.  Dispense: 30 capsule; Refill: 0   General Counseling: jahsir rama understanding of the findings of todays visit and agrees with plan of treatment. I have discussed any further diagnostic evaluation that may be needed or ordered today. We also reviewed his medications today. he has been encouraged to call the office with any questions or concerns that should arise related to todays visit.    No orders of the defined types were placed in this encounter.   Meds ordered this encounter  Medications   meloxicam (MOBIC) 15 MG tablet    Sig: Take 1 tablet (15 mg total) by mouth daily. In am with breakfast    Dispense:  30 tablet    Refill:  1   cyclobenzaprine (FLEXERIL) 10 MG tablet    Sig: Take 1 tablet (10 mg total) by mouth at bedtime.    Dispense:  30 tablet    Refill:  1   sildenafil (VIAGRA) 100 MG tablet    Sig: TAKE 1/2 TO 1 TABLET(50 TO 100 MG) BY MOUTH DAILY AS NEEDED FOR ERECTILE DYSFUNCTION    Dispense:  10 tablet    Refill:  5   amphetamine-dextroamphetamine  (ADDERALL) 10 MG tablet    Sig: Take 1 tablet (10 mg total) by mouth daily at 12 noon.    Dispense:  30 tablet    Refill:  0    This dose is in additional to the adderal XR dose. Fill for june   amphetamine-dextroamphetamine (ADDERALL) 10 MG tablet    Sig: Take 1 tablet (10 mg total) by mouth daily at 12 noon.    Dispense:  30 tablet    Refill:  0  This dose is in additional to the adderal XR dose. Fill for may   amphetamine-dextroamphetamine (ADDERALL) 10 MG tablet    Sig: Take 1 tablet (10 mg total) by mouth daily at 12 noon.    Dispense:  30 tablet    Refill:  0    This dose is in additional to the adderal XR dose. Fill for april   amphetamine-dextroamphetamine (ADDERALL XR) 15 MG 24 hr capsule    Sig: Take 1 capsule by mouth every morning.    Dispense:  30 capsule    Refill:  0    Fill for june   amphetamine-dextroamphetamine (ADDERALL XR) 15 MG 24 hr capsule    Sig: Take 1 capsule by mouth every morning.    Dispense:  30 capsule    Refill:  0    Fill for may   amphetamine-dextroamphetamine (ADDERALL XR) 15 MG 24 hr capsule    Sig: Take 1 capsule by mouth every morning.    Dispense:  30 capsule    Refill:  0    Fill for april   fexofenadine-pseudoephedrine (ALLEGRA-D) 60-120 MG 12 hr tablet    Sig: Take 1 tablet by mouth 2 (two) times daily.    Dispense:  60 tablet    Refill:  2    Please fill for 1 month supply at a time.    Return in about 3 months (around 08/16/2022) for F/U, ADHD med check, med refill, Huntley Knoop PCP.   Total time spent:30 Minutes Time spent includes review of chart, medications, test results, and follow up plan with the patient.    Controlled Substance Database was reviewed by me.  This patient was seen by Sallyanne Kuster, FNP-C in collaboration with Dr. Beverely Risen as a part of collaborative care agreement.   Prue Lingenfelter R. Tedd Sias, MSN, FNP-C Internal medicine

## 2022-05-27 ENCOUNTER — Encounter: Payer: Self-pay | Admitting: Nurse Practitioner

## 2022-08-11 ENCOUNTER — Encounter: Payer: Self-pay | Admitting: Nurse Practitioner

## 2022-08-11 ENCOUNTER — Ambulatory Visit: Payer: BC Managed Care – PPO | Admitting: Nurse Practitioner

## 2022-08-11 VITALS — BP 130/76 | HR 67 | Temp 98.2°F | Resp 16 | Ht 70.0 in | Wt 207.2 lb

## 2022-08-11 DIAGNOSIS — Z9109 Other allergy status, other than to drugs and biological substances: Secondary | ICD-10-CM | POA: Diagnosis not present

## 2022-08-11 DIAGNOSIS — N50819 Testicular pain, unspecified: Secondary | ICD-10-CM

## 2022-08-11 DIAGNOSIS — L739 Follicular disorder, unspecified: Secondary | ICD-10-CM

## 2022-08-11 DIAGNOSIS — N529 Male erectile dysfunction, unspecified: Secondary | ICD-10-CM | POA: Diagnosis not present

## 2022-08-11 DIAGNOSIS — R1031 Right lower quadrant pain: Secondary | ICD-10-CM

## 2022-08-11 DIAGNOSIS — F902 Attention-deficit hyperactivity disorder, combined type: Secondary | ICD-10-CM

## 2022-08-11 MED ORDER — MUPIROCIN CALCIUM 2 % EX CREA
1.0000 | TOPICAL_CREAM | Freq: Two times a day (BID) | CUTANEOUS | 1 refills | Status: DC
Start: 2022-08-11 — End: 2023-11-30

## 2022-08-11 MED ORDER — AMPHETAMINE-DEXTROAMPHETAMINE 10 MG PO TABS
10.0000 mg | ORAL_TABLET | Freq: Every day | ORAL | 0 refills | Status: DC
Start: 2022-08-11 — End: 2022-09-08

## 2022-08-11 MED ORDER — AMPHETAMINE-DEXTROAMPHET ER 20 MG PO CP24
20.0000 mg | ORAL_CAPSULE | Freq: Every day | ORAL | 0 refills | Status: DC
Start: 2022-08-11 — End: 2022-09-08

## 2022-08-11 MED ORDER — FEXOFENADINE-PSEUDOEPHED ER 60-120 MG PO TB12
1.0000 | ORAL_TABLET | Freq: Two times a day (BID) | ORAL | 2 refills | Status: DC
Start: 2022-08-11 — End: 2022-10-09

## 2022-08-11 MED ORDER — MELOXICAM 15 MG PO TABS
15.0000 mg | ORAL_TABLET | Freq: Every day | ORAL | 1 refills | Status: DC
Start: 2022-08-11 — End: 2023-06-01

## 2022-08-11 MED ORDER — CYCLOBENZAPRINE HCL 10 MG PO TABS
10.0000 mg | ORAL_TABLET | Freq: Every day | ORAL | 2 refills | Status: DC
Start: 2022-08-11 — End: 2023-06-01

## 2022-08-11 MED ORDER — AMPHETAMINE-DEXTROAMPHETAMINE 10 MG PO TABS
10.0000 mg | ORAL_TABLET | Freq: Every day | ORAL | 0 refills | Status: DC
Start: 2022-10-06 — End: 2022-10-06

## 2022-08-11 MED ORDER — AMPHETAMINE-DEXTROAMPHET ER 20 MG PO CP24
20.0000 mg | ORAL_CAPSULE | Freq: Every day | ORAL | 0 refills | Status: DC
Start: 2022-09-08 — End: 2022-10-06

## 2022-08-11 MED ORDER — SILDENAFIL CITRATE 100 MG PO TABS
ORAL_TABLET | ORAL | 5 refills | Status: DC
Start: 2022-08-11 — End: 2022-11-16

## 2022-08-11 MED ORDER — AMPHETAMINE-DEXTROAMPHETAMINE 10 MG PO TABS
10.0000 mg | ORAL_TABLET | Freq: Every day | ORAL | 0 refills | Status: DC
Start: 2022-09-08 — End: 2022-09-08

## 2022-08-11 MED ORDER — AMPHETAMINE-DEXTROAMPHET ER 20 MG PO CP24
20.0000 mg | ORAL_CAPSULE | Freq: Every day | ORAL | 0 refills | Status: DC
Start: 2022-10-06 — End: 2022-09-08

## 2022-08-11 NOTE — Progress Notes (Signed)
[facility] [address] [place], [place] [address]  Internal MEDICINE  Office Visit Note  Patient Name: Joseph Ochoa  [id number]  [id number]  Date of Service: 08/11/2022  Chief Complaint  Patient presents with   Follow-up    Adhd med check     HPI Tremar presents for a follow-up visit for  ADHD --  2 ingrown hairs in groin area -- taking a long time to heal. arthritis    Current Medication: Outpatient Encounter Medications as of 08/11/2022  Medication Sig   amphetamine-dextroamphetamine (ADDERALL XR) 20 MG 24 hr capsule Take 1 capsule (20 mg total) by mouth daily.   [START ON 09/08/2022] amphetamine-dextroamphetamine (ADDERALL XR) 20 MG 24 hr capsule Take 1 capsule (20 mg total) by mouth daily.   [START ON 10/06/2022] amphetamine-dextroamphetamine (ADDERALL XR) 20 MG 24 hr capsule Take 1 capsule (20 mg total) by mouth daily.   gabapentin (NEURONTIN) 100 MG capsule Take 1-3 capsules nightly   ibuprofen (ADVIL) 600 MG tablet Take 1 tablet (600 mg total) by mouth every 6 (six) hours as needed.   mupirocin cream (BACTROBAN) 2 % Apply 1 Application topically 2 (two) times daily. To affected area until healed.   valACYclovir (VALTREX) 1000 MG tablet TAKE 2 TABLETS BY MOUTH EVERY 12 HOURS during outbreak   [DISCONTINUED] amphetamine-dextroamphetamine (ADDERALL XR) 15 MG 24 hr capsule Take 1 capsule by mouth every morning.   [DISCONTINUED] amphetamine-dextroamphetamine (ADDERALL XR) 15 MG 24 hr capsule Take 1 capsule by mouth every morning.   [DISCONTINUED] amphetamine-dextroamphetamine (ADDERALL XR) 15 MG 24 hr capsule Take 1 capsule by mouth every morning.   [DISCONTINUED] amphetamine-dextroamphetamine (ADDERALL) 10 MG tablet Take 1 tablet (10 mg total) by mouth daily at 12 noon.   [DISCONTINUED] amphetamine-dextroamphetamine (ADDERALL) 10 MG tablet Take 1 tablet (10 mg total) by mouth daily at 12 noon.   [DISCONTINUED] amphetamine-dextroamphetamine (ADDERALL) 10 MG  tablet Take 1 tablet (10 mg total) by mouth daily at 12 noon.   [DISCONTINUED] cyclobenzaprine (FLEXERIL) 10 MG tablet Take 1 tablet (10 mg total) by mouth at bedtime.   [DISCONTINUED] fexofenadine-pseudoephedrine (ALLEGRA-D) 60-120 MG 12 hr tablet Take 1 tablet by mouth 2 (two) times daily.   [DISCONTINUED] meloxicam (MOBIC) 15 MG tablet Take 1 tablet (15 mg total) by mouth daily. In am with breakfast   [DISCONTINUED] sildenafil (VIAGRA) 100 MG tablet TAKE 1/2 TO 1 TABLET(50 TO 100 MG) BY MOUTH DAILY AS NEEDED FOR ERECTILE DYSFUNCTION   [START ON 10/06/2022] amphetamine-dextroamphetamine (ADDERALL) 10 MG tablet Take 1 tablet (10 mg total) by mouth daily at 12 noon.   [START ON 09/08/2022] amphetamine-dextroamphetamine (ADDERALL) 10 MG tablet Take 1 tablet (10 mg total) by mouth daily at 12 noon.   amphetamine-dextroamphetamine (ADDERALL) 10 MG tablet Take 1 tablet (10 mg total) by mouth daily at 12 noon.   amphetamine-dextroamphetamine (ADDERALL) 10 MG tablet Take 1 tablet (10 mg total) by mouth daily at 12 noon.   cyclobenzaprine (FLEXERIL) 10 MG tablet Take 1 tablet (10 mg total) by mouth at bedtime.   fexofenadine-pseudoephedrine (ALLEGRA-D) 60-120 MG 12 hr tablet Take 1 tablet by mouth 2 (two) times daily.   meloxicam (MOBIC) 15 MG tablet Take 1 tablet (15 mg total) by mouth daily. In am with breakfast   sildenafil (VIAGRA) 100 MG tablet TAKE 1/2 TO 1 TABLET(50 TO 100 MG) BY MOUTH DAILY AS NEEDED FOR ERECTILE DYSFUNCTION   No facility-administered encounter medications on file as of 08/11/2022.    Surgical History: Past Surgical History:  Procedure Laterality Date   INGUINAL HERNIA REPAIR Left    LYMPHANGIOMA EXCISION Right 2001   on right shoulder    Medical History: Past Medical History:  Diagnosis Date   Anxiety    Lyme disease     Family History: Family History  Problem Relation Age of Onset   Hyperlipidemia Mother    Epilepsy Mother    Hypertension Father    Prostate cancer  Father    Diabetes Maternal Grandfather    Glaucoma Maternal Grandfather    Arthritis Maternal Grandfather    Hypertension Maternal Grandfather    Prostate cancer Maternal Grandfather    Hypertension Paternal Grandfather    Prostate cancer Paternal Grandfather     Social History   Socioeconomic History   Marital status: Married    Spouse name: Not on file   Number of children: Not on file   Years of education: Not on file   Highest education level: Not on file  Occupational History   Not on file  Tobacco Use   Smoking status: Former    Current packs/day: 0.00    Types: Cigarettes    Quit date: 06/30/2016    Years since quitting: 6.1   Smokeless tobacco: Never  Vaping Use   Vaping status: Never Used  Substance and Sexual Activity   Alcohol use: Yes    Comment: socially   Drug use: Never   Sexual activity: Yes  Other Topics Concern   Not on file  Social History Narrative   Not on file   Social Determinants of Health   Financial Resource Strain: Not on file  Food Insecurity: Not on file  Transportation Needs: Not on file  Physical Activity: Not on file  Stress: Not on file  Social Connections: Not on file  Intimate Partner Violence: Not on file      Review of Systems  Vital Signs: BP 130/76 Comment: 140/86  Pulse 67   Temp 98.2 F (36.8 C)   Resp 16   Ht 5\' 10"  (1.778 m)   Wt 207 lb 3.2 oz (94 kg)   SpO2 97%   BMI 29.73 kg/m    Physical Exam     Assessment/Plan:   General Counseling: Keeyan verbalizes understanding of the findings of todays visit and agrees with plan of treatment. I have discussed any further diagnostic evaluation that may be needed or ordered today. We also reviewed his medications today. he has been encouraged to call the office with any questions or concerns that should arise related to todays visit.    Orders Placed This Encounter  Procedures   Ambulatory referral to Urology    Meds ordered this encounter   Medications   amphetamine-dextroamphetamine (ADDERALL XR) 20 MG 24 hr capsule    Sig: Take 1 capsule (20 mg total) by mouth daily.    Dispense:  30 capsule    Refill:  0    Increased dose of adderall xr, fill for july   amphetamine-dextroamphetamine (ADDERALL) 10 MG tablet    Sig: Take 1 tablet (10 mg total) by mouth daily at 12 noon.    Dispense:  30 tablet    Refill:  0    This dose is in additional to the adderal XR dose. Fill for september   amphetamine-dextroamphetamine (ADDERALL) 10 MG tablet    Sig: Take 1 tablet (10 mg total) by mouth daily at 12 noon.    Dispense:  30 tablet    Refill:  0    This  dose is in additional to the adderal XR dose. Fill for august   amphetamine-dextroamphetamine (ADDERALL) 10 MG tablet    Sig: Take 1 tablet (10 mg total) by mouth daily at 12 noon.    Dispense:  30 tablet    Refill:  0    This dose is in additional to the adderal XR dose. Fill for april   amphetamine-dextroamphetamine (ADDERALL XR) 20 MG 24 hr capsule    Sig: Take 1 capsule (20 mg total) by mouth daily.    Dispense:  30 capsule    Refill:  0    Fill for august   amphetamine-dextroamphetamine (ADDERALL XR) 20 MG 24 hr capsule    Sig: Take 1 capsule (20 mg total) by mouth daily.    Dispense:  30 capsule    Refill:  0    Fill for september   amphetamine-dextroamphetamine (ADDERALL) 10 MG tablet    Sig: Take 1 tablet (10 mg total) by mouth daily at 12 noon.    Dispense:  30 tablet    Refill:  0    This dose is in additional to the adderal XR dose. Fill for july   cyclobenzaprine (FLEXERIL) 10 MG tablet    Sig: Take 1 tablet (10 mg total) by mouth at bedtime.    Dispense:  30 tablet    Refill:  2   sildenafil (VIAGRA) 100 MG tablet    Sig: TAKE 1/2 TO 1 TABLET(50 TO 100 MG) BY MOUTH DAILY AS NEEDED FOR ERECTILE DYSFUNCTION    Dispense:  10 tablet    Refill:  5   meloxicam (MOBIC) 15 MG tablet    Sig: Take 1 tablet (15 mg total) by mouth daily. In am with breakfast     Dispense:  30 tablet    Refill:  1   fexofenadine-pseudoephedrine (ALLEGRA-D) 60-120 MG 12 hr tablet    Sig: Take 1 tablet by mouth 2 (two) times daily.    Dispense:  60 tablet    Refill:  2    Please fill for 1 month supply at a time.   mupirocin cream (BACTROBAN) 2 %    Sig: Apply 1 Application topically 2 (two) times daily. To affected area until healed.    Dispense:  30 g    Refill:  1    Return in about 3 months (around 11/02/2022) for F/U, ADHD med check, Johnpatrick Jenny PCP.   Total time spent:*** Minutes Time spent includes review of chart, medications, test results, and follow up plan with the patient.   [place] Controlled Substance Database was reviewed by me.  This patient was seen by Sallyanne Kuster, FNP-C in collaboration with Dr. Beverely Risen as a part of collaborative care agreement.   Edwardine Deschepper R. Tedd Sias, MSN, FNP-C Internal medicine

## 2022-08-12 ENCOUNTER — Encounter: Payer: Self-pay | Admitting: Nurse Practitioner

## 2022-08-25 ENCOUNTER — Ambulatory Visit: Payer: BC Managed Care – PPO | Admitting: Physician Assistant

## 2022-09-01 ENCOUNTER — Ambulatory Visit: Payer: BC Managed Care – PPO | Admitting: Physician Assistant

## 2022-09-08 ENCOUNTER — Ambulatory Visit: Payer: BC Managed Care – PPO | Admitting: Physician Assistant

## 2022-09-08 ENCOUNTER — Encounter: Payer: Self-pay | Admitting: Physician Assistant

## 2022-09-08 VITALS — BP 129/84 | HR 96 | Wt 206.0 lb

## 2022-09-08 DIAGNOSIS — Z8042 Family history of malignant neoplasm of prostate: Secondary | ICD-10-CM

## 2022-09-08 DIAGNOSIS — N5312 Painful ejaculation: Secondary | ICD-10-CM | POA: Diagnosis not present

## 2022-09-08 DIAGNOSIS — N5082 Scrotal pain: Secondary | ICD-10-CM | POA: Diagnosis not present

## 2022-09-08 LAB — URINALYSIS, COMPLETE
Bilirubin, UA: NEGATIVE
Glucose, UA: NEGATIVE
Ketones, UA: NEGATIVE
Leukocytes,UA: NEGATIVE
Nitrite, UA: NEGATIVE
Protein,UA: NEGATIVE
RBC, UA: NEGATIVE
Specific Gravity, UA: 1.02 (ref 1.005–1.030)
Urobilinogen, Ur: 0.2 mg/dL (ref 0.2–1.0)
pH, UA: 6 (ref 5.0–7.5)

## 2022-09-08 LAB — MICROSCOPIC EXAMINATION

## 2022-09-08 NOTE — Progress Notes (Signed)
09/08/2022 11:21 AM   Joseph Ochoa 16-Apr-1985 [id number]  CC: Chief Complaint  Patient presents with   Testicle Pain   HPI: Joseph Ochoa is a 37 y.o. male with PMH chronic scrotal pain on gabapentin and ED on sildenafil who presents today for evaluation of ongoing scrotal pain.   Today he reports chronic bilateral, L>R scrotal pain.  This is unchanged compared to prior.  He denies dysuria, gross hematuria, or penile discharge.  He noticed some scrotal swelling a couple of months ago that has since resolved.  Additionally, he reports 2 months of stabbing pain at the base of his penis with ejaculation.  He previously completed a few sessions of pelvic floor PT, but stopped this due to cost.  He was rereferred to PT, but did not complete this due to cost and scheduling/availability concerns.  Notably, he has a family history of prostate cancer in his father and grandfather.  He states both of their cancers were associated with normal PSAs.  No other family history of cancer.  They have not seen genetics. Pertinent details as provided by patient below: Father: Diagnosed in mid 30s, living, now s/p prostatectomy Grandfather: Unknown age at diagnosis, living, now with bony metastasis  In-office UA and microscopy today pan-negative.  PMH: Past Medical History:  Diagnosis Date   Anxiety    Lyme disease     Surgical History: Past Surgical History:  Procedure Laterality Date   INGUINAL HERNIA REPAIR Left    LYMPHANGIOMA EXCISION Right 2001   on right shoulder    Home Medications:  Allergies as of 09/08/2022   No Known Allergies      Medication List        Accurate as of September 08, 2022 11:21 AM. If you have any questions, ask your nurse or doctor.          amphetamine-dextroamphetamine 20 MG 24 hr capsule Commonly known as: ADDERALL XR Take 1 capsule (20 mg total) by mouth daily. What changed: Another medication with the same name was removed. Continue taking  this medication, and follow the directions you see here. Changed by: Carman Ching   amphetamine-dextroamphetamine 10 MG tablet Commonly known as: ADDERALL Take 1 tablet (10 mg total) by mouth daily at 12 noon. Start taking on: October 06, 2022 What changed: Another medication with the same name was removed. Continue taking this medication, and follow the directions you see here. Changed by: Carman Ching   cyclobenzaprine 10 MG tablet Commonly known as: FLEXERIL Take 1 tablet (10 mg total) by mouth at bedtime.   fexofenadine-pseudoephedrine 60-120 MG 12 hr tablet Commonly known as: ALLEGRA-D Take 1 tablet by mouth 2 (two) times daily.   gabapentin 100 MG capsule Commonly known as: NEURONTIN Take 1-3 capsules nightly   ibuprofen 600 MG tablet Commonly known as: ADVIL Take 1 tablet (600 mg total) by mouth every 6 (six) hours as needed.   meloxicam 15 MG tablet Commonly known as: MOBIC Take 1 tablet (15 mg total) by mouth daily. In am with breakfast   mupirocin cream 2 % Commonly known as: BACTROBAN Apply 1 Application topically 2 (two) times daily. To affected area until healed.   sildenafil 100 MG tablet Commonly known as: VIAGRA TAKE 1/2 TO 1 TABLET(50 TO 100 MG) BY MOUTH DAILY AS NEEDED FOR ERECTILE DYSFUNCTION   valACYclovir 1000 MG tablet Commonly known as: VALTREX TAKE 2 TABLETS BY MOUTH EVERY 12 HOURS during outbreak        Allergies:  No  Known Allergies  Family History: Family History  Problem Relation Age of Onset   Hyperlipidemia Mother    Epilepsy Mother    Hypertension Father    Prostate cancer Father    Diabetes Maternal Grandfather    Glaucoma Maternal Grandfather    Arthritis Maternal Grandfather    Hypertension Maternal Grandfather    Prostate cancer Maternal Grandfather    Hypertension Paternal Grandfather    Prostate cancer Paternal Grandfather     Social History:   reports that he quit smoking about 6 years ago. His  smoking use included cigarettes. He has never used smokeless tobacco. He reports current alcohol use. He reports that he does not use drugs.  Physical Exam: BP 129/84   Pulse 96   Wt 206 lb (93.4 kg)   BMI 29.56 kg/m   Constitutional:  Alert and oriented, no acute distress, nontoxic appearing HEENT: [place], AT Cardiovascular: No clubbing, cyanosis, or edema Respiratory: Normal respiratory effort, no increased work of breathing GU: Bilateral descended testicles.  No testicular masses.  Nonenlarged, nontender bilateral epididymides.   DRE: Normal sphincter tone.  No external hemorrhoids.  Smooth, symmetrically enlarged 30+ cc prostate without nodules, induration, or bogginess. Skin: No rashes, bruises or suspicious lesions Neurologic: Grossly intact, no focal deficits, moving all 4 extremities Psychiatric: Normal mood and affect  Laboratory Data: Results for orders placed or performed in visit on 09/08/22  Microscopic Examination   Urine  Result Value Ref Range   WBC, UA 0-5 0 - 5 /hpf   RBC, Urine 0-2 0 - 2 /hpf   Epithelial Cells (non renal) 0-10 0 - 10 /hpf   Bacteria, UA Few None seen/Few  Urinalysis, Complete  Result Value Ref Range   Specific Gravity, UA 1.020 1.005 - 1.030   pH, UA 6.0 5.0 - 7.5   Color, UA Yellow Yellow   Appearance Ur Clear Clear   Leukocytes,UA Negative Negative   Protein,UA Negative Negative/Trace   Glucose, UA Negative Negative   Ketones, UA Negative Negative   RBC, UA Negative Negative   Bilirubin, UA Negative Negative   Urobilinogen, Ur 0.2 0.2 - 1.0 mg/dL   Nitrite, UA Negative Negative   Microscopic Examination See below:    Assessment & Plan:   1. Scrotal pain Chronic.  Unfortunately, it appears his availability and cost are going to be barriers to him completing pelvic floor PT.  We discussed that in the absence of pelvic floor PT, our options are limited.  We discussed pharmaceutical options including Celebrex versus augmenting gabapentin  with amitriptyline.  He is unsure how he would like to proceed and will reach out via MyChart when he decides.  We also discussed consideration of pain clinic referral for possible nerve block, though I do not anticipate this will resolve the painful ejaculation issue. - Urinalysis, Complete  2. Painful ejaculation Likely related to #1/pelvic floor dysfunction.  Low suspicion for prostatitis with benign UA and noncontributory DRE today. - Urinalysis, Complete  3. Family history of prostate cancer He reports a family history of early onset prostate cancer in his father as well as metastatic prostate cancer in his grandfather.  DRE was benign today, though I obtained a PSA for monitoring.  If PSA is elevated, would recommend pursuing prostate MR.  Otherwise, we discussed continuing with annual DRE and PSA.  We also discussed consideration of genetics referral given his high risk family history, but he prefers to defer this at this time. - PSA  Return in about  1 year (around 09/08/2023) for Annual DRE/PSA, will call with results.  Carman Ching, PA-C  [facility] [address] [place], [place] [address] [phone]

## 2022-10-06 ENCOUNTER — Telehealth: Payer: Self-pay

## 2022-10-06 DIAGNOSIS — F902 Attention-deficit hyperactivity disorder, combined type: Secondary | ICD-10-CM

## 2022-10-06 MED ORDER — AMPHETAMINE-DEXTROAMPHETAMINE 10 MG PO TABS: 10.0000 mg | ORAL_TABLET | Freq: Every day | ORAL | 0 refills | Status: DC

## 2022-10-06 MED ORDER — AMPHETAMINE-DEXTROAMPHET ER 20 MG PO CP24
20.0000 mg | ORAL_CAPSULE | Freq: Every day | ORAL | 0 refills | Status: DC
Start: 2022-10-06 — End: 2022-11-16

## 2022-10-06 NOTE — Telephone Encounter (Signed)
Pt.notified

## 2022-10-08 ENCOUNTER — Other Ambulatory Visit: Payer: Self-pay | Admitting: Nurse Practitioner

## 2022-10-08 DIAGNOSIS — Z9109 Other allergy status, other than to drugs and biological substances: Secondary | ICD-10-CM

## 2022-11-03 ENCOUNTER — Ambulatory Visit: Payer: BC Managed Care – PPO | Admitting: Nurse Practitioner

## 2022-11-16 ENCOUNTER — Encounter: Payer: Self-pay | Admitting: Nurse Practitioner

## 2022-11-16 ENCOUNTER — Ambulatory Visit: Payer: BC Managed Care – PPO | Admitting: Nurse Practitioner

## 2022-11-16 ENCOUNTER — Telehealth: Payer: Self-pay | Admitting: Nurse Practitioner

## 2022-11-16 VITALS — BP 134/80 | HR 76 | Temp 98.0°F | Resp 16 | Ht 70.0 in | Wt 210.2 lb

## 2022-11-16 DIAGNOSIS — N529 Male erectile dysfunction, unspecified: Secondary | ICD-10-CM

## 2022-11-16 DIAGNOSIS — F902 Attention-deficit hyperactivity disorder, combined type: Secondary | ICD-10-CM | POA: Diagnosis not present

## 2022-11-16 DIAGNOSIS — Z9109 Other allergy status, other than to drugs and biological substances: Secondary | ICD-10-CM | POA: Diagnosis not present

## 2022-11-16 MED ORDER — AMPHETAMINE-DEXTROAMPHETAMINE 10 MG PO TABS: 10.0000 mg | ORAL_TABLET | Freq: Every day | ORAL | 0 refills | Status: DC

## 2022-11-16 MED ORDER — AMPHETAMINE-DEXTROAMPHET ER 20 MG PO CP24
20.0000 mg | ORAL_CAPSULE | Freq: Every day | ORAL | 0 refills | Status: DC
Start: 2022-11-16 — End: 2023-02-22

## 2022-11-16 MED ORDER — AMPHETAMINE-DEXTROAMPHETAMINE 10 MG PO TABS
10.0000 mg | ORAL_TABLET | Freq: Every day | ORAL | 0 refills | Status: DC
Start: 2022-11-16 — End: 2023-03-02

## 2022-11-16 MED ORDER — AMPHETAMINE-DEXTROAMPHETAMINE 10 MG PO TABS
10.0000 mg | ORAL_TABLET | Freq: Every day | ORAL | 0 refills | Status: DC
Start: 2023-01-11 — End: 2023-03-02

## 2022-11-16 MED ORDER — SILDENAFIL CITRATE 100 MG PO TABS
ORAL_TABLET | ORAL | 5 refills | Status: DC
Start: 2022-11-16 — End: 2023-09-07

## 2022-11-16 MED ORDER — AMPHETAMINE-DEXTROAMPHET ER 20 MG PO CP24: 20.0000 mg | ORAL_CAPSULE | Freq: Every day | ORAL | 0 refills | Status: DC

## 2022-11-16 MED ORDER — AMPHETAMINE-DEXTROAMPHET ER 20 MG PO CP24
20.0000 mg | ORAL_CAPSULE | Freq: Every day | ORAL | 0 refills | Status: DC
Start: 2023-01-11 — End: 2023-03-02

## 2022-11-16 NOTE — Progress Notes (Signed)
[facility] [address] [place], [place] [address]  Internal MEDICINE  Office Visit Note  Patient Name: Joseph Ochoa  [id number]  [id number]  Date of Service: 11/16/2022  Chief Complaint  Patient presents with   Follow-up    HPI Jaspal presents for a follow-up visit for ADHD ADHD -- current dose remains effective. His heart rate and blood pressure are normal. Denies any palpitations or other adverse effects of the medication.  ED -- requesting refills of viagra  Allergies -- takes allegra --D which helps .    Current Medication: Outpatient Encounter Medications as of 11/16/2022  Medication Sig   ALLEGRA-D ALLERGY & CONGESTION 60-120 MG 12 hr tablet Take 1 tablet by mouth twice daily.   cyclobenzaprine (FLEXERIL) 10 MG tablet Take 1 tablet (10 mg total) by mouth at bedtime.   gabapentin (NEURONTIN) 100 MG capsule Take 1-3 capsules nightly   ibuprofen (ADVIL) 600 MG tablet Take 1 tablet (600 mg total) by mouth every 6 (six) hours as needed.   meloxicam (MOBIC) 15 MG tablet Take 1 tablet (15 mg total) by mouth daily. In am with breakfast   mupirocin cream (BACTROBAN) 2 % Apply 1 Application topically 2 (two) times daily. To affected area until healed.   valACYclovir (VALTREX) 1000 MG tablet TAKE 2 TABLETS BY MOUTH EVERY 12 HOURS during outbreak   [DISCONTINUED] amphetamine-dextroamphetamine (ADDERALL XR) 20 MG 24 hr capsule Take 1 capsule (20 mg total) by mouth daily.   [DISCONTINUED] amphetamine-dextroamphetamine (ADDERALL) 10 MG tablet Take 1 tablet (10 mg total) by mouth daily at 12 noon.   [DISCONTINUED] sildenafil (VIAGRA) 100 MG tablet TAKE 1/2 TO 1 TABLET(50 TO 100 MG) BY MOUTH DAILY AS NEEDED FOR ERECTILE DYSFUNCTION   amphetamine-dextroamphetamine (ADDERALL XR) 20 MG 24 hr capsule Take 1 capsule (20 mg total) by mouth daily.   [START ON 12/14/2022] amphetamine-dextroamphetamine (ADDERALL XR) 20 MG 24 hr capsule Take 1 capsule (20 mg total) by mouth daily.    [START ON 01/11/2023] amphetamine-dextroamphetamine (ADDERALL XR) 20 MG 24 hr capsule Take 1 capsule (20 mg total) by mouth daily.   amphetamine-dextroamphetamine (ADDERALL) 10 MG tablet Take 1 tablet (10 mg total) by mouth daily at 12 noon.   [START ON 12/14/2022] amphetamine-dextroamphetamine (ADDERALL) 10 MG tablet Take 1 tablet (10 mg total) by mouth daily at 12 noon.   [START ON 01/11/2023] amphetamine-dextroamphetamine (ADDERALL) 10 MG tablet Take 1 tablet (10 mg total) by mouth daily at 12 noon.   sildenafil (VIAGRA) 100 MG tablet TAKE 1/2 TO 1 TABLET(50 TO 100 MG) BY MOUTH DAILY AS NEEDED FOR ERECTILE DYSFUNCTION   No facility-administered encounter medications on file as of 11/16/2022.    Surgical History: Past Surgical History:  Procedure Laterality Date   INGUINAL HERNIA REPAIR Left    LYMPHANGIOMA EXCISION Right 2001   on right shoulder    Medical History: Past Medical History:  Diagnosis Date   Anxiety    Lyme disease     Family History: Family History  Problem Relation Age of Onset   Hyperlipidemia Mother    Epilepsy Mother    Hypertension Father    Prostate cancer Father    Diabetes Maternal Grandfather    Glaucoma Maternal Grandfather    Arthritis Maternal Grandfather    Hypertension Maternal Grandfather    Prostate cancer Maternal Grandfather    Hypertension Paternal Grandfather    Prostate cancer Paternal Grandfather     Social History   Socioeconomic History   Marital status: Married  Spouse name: Not on file   Number of children: Not on file   Years of education: Not on file   Highest education level: Not on file  Occupational History   Not on file  Tobacco Use   Smoking status: Former    Current packs/day: 0.00    Types: Cigarettes    Quit date: 06/30/2016    Years since quitting: 6.3   Smokeless tobacco: Never  Vaping Use   Vaping status: Never Used  Substance and Sexual Activity   Alcohol use: Yes    Comment: socially   Drug use:  Never   Sexual activity: Yes  Other Topics Concern   Not on file  Social History Narrative   Not on file   Social Determinants of Health   Financial Resource Strain: Not on file  Food Insecurity: Not on file  Transportation Needs: Not on file  Physical Activity: Not on file  Stress: Not on file  Social Connections: Not on file  Intimate Partner Violence: Not on file      Review of Systems  Constitutional:  Positive for fatigue. Negative for activity change, appetite change, chills, fever and unexpected weight change.  HENT: Negative.  Negative for congestion, ear pain, rhinorrhea, sore throat and trouble swallowing.   Eyes: Negative.   Respiratory: Negative.  Negative for cough, chest tightness, shortness of breath and wheezing.   Cardiovascular: Negative.  Negative for chest pain and palpitations.  Gastrointestinal: Negative.  Negative for abdominal pain, blood in stool, constipation, diarrhea, nausea and vomiting.  Endocrine: Negative.   Genitourinary: Negative.  Negative for difficulty urinating, dysuria, frequency, hematuria and urgency.  Musculoskeletal: Negative.  Negative for arthralgias, back pain, joint swelling, myalgias and neck pain.  Skin: Negative.  Negative for rash and wound.  Allergic/Immunologic: Negative.  Negative for immunocompromised state.  Neurological: Negative.  Negative for dizziness, seizures, numbness and headaches.  Hematological: Negative.   Psychiatric/Behavioral:  Positive for decreased concentration. Negative for behavioral problems, self-injury, sleep disturbance and suicidal ideas. The patient is nervous/anxious.     Vital Signs: BP 134/80   Pulse 76   Temp 98 F (36.7 C)   Resp 16   Ht 5\' 10"  (1.778 m)   Wt 210 lb 3.2 oz (95.3 kg)   SpO2 97%   BMI 30.16 kg/m    Physical Exam Vitals reviewed.  Constitutional:      General: He is not in acute distress.    Appearance: Normal appearance. He is obese. He is not ill-appearing.   HENT:     Head: Normocephalic and atraumatic.  Eyes:     Pupils: Pupils are equal, round, and reactive to light.  Cardiovascular:     Rate and Rhythm: Normal rate and regular rhythm.  Pulmonary:     Effort: Pulmonary effort is normal. No respiratory distress.  Neurological:     Mental Status: He is alert and oriented to person, place, and time.  Psychiatric:        Mood and Affect: Mood normal.        Behavior: Behavior normal.        Assessment/Plan: 1. Erectile dysfunction, unspecified erectile dysfunction type Continue prn use of sildenafil - sildenafil (VIAGRA) 100 MG tablet; TAKE 1/2 TO 1 TABLET(50 TO 100 MG) BY MOUTH DAILY AS NEEDED FOR ERECTILE DYSFUNCTION  Dispense: 10 tablet; Refill: 5  2. Multiple environmental allergies Continue prn use of allegra-D as prescribed   3. ADHD (attention deficit hyperactivity disorder), combined type Continue adderall as  prescribed. Follow up in 3 months for additional refills, UDS at next office visit.  - amphetamine-dextroamphetamine (ADDERALL) 10 MG tablet; Take 1 tablet (10 mg total) by mouth daily at 12 noon.  Dispense: 30 tablet; Refill: 0 - amphetamine-dextroamphetamine (ADDERALL XR) 20 MG 24 hr capsule; Take 1 capsule (20 mg total) by mouth daily.  Dispense: 30 capsule; Refill: 0 - amphetamine-dextroamphetamine (ADDERALL XR) 20 MG 24 hr capsule; Take 1 capsule (20 mg total) by mouth daily.  Dispense: 30 capsule; Refill: 0 - amphetamine-dextroamphetamine (ADDERALL XR) 20 MG 24 hr capsule; Take 1 capsule (20 mg total) by mouth daily.  Dispense: 30 capsule; Refill: 0 - amphetamine-dextroamphetamine (ADDERALL) 10 MG tablet; Take 1 tablet (10 mg total) by mouth daily at 12 noon.  Dispense: 30 tablet; Refill: 0 - amphetamine-dextroamphetamine (ADDERALL) 10 MG tablet; Take 1 tablet (10 mg total) by mouth daily at 12 noon.  Dispense: 30 tablet; Refill: 0   General Counseling: travius tenner understanding of the findings of todays  visit and agrees with plan of treatment. I have discussed any further diagnostic evaluation that may be needed or ordered today. We also reviewed his medications today. he has been encouraged to call the office with any questions or concerns that should arise related to todays visit.    No orders of the defined types were placed in this encounter.   Meds ordered this encounter  Medications   amphetamine-dextroamphetamine (ADDERALL) 10 MG tablet    Sig: Take 1 tablet (10 mg total) by mouth daily at 12 noon.    Dispense:  30 tablet    Refill:  0    This dose is in additional to the adderal XR dose. Fill for october   amphetamine-dextroamphetamine (ADDERALL XR) 20 MG 24 hr capsule    Sig: Take 1 capsule (20 mg total) by mouth daily.    Dispense:  30 capsule    Refill:  0    Fill for october   amphetamine-dextroamphetamine (ADDERALL XR) 20 MG 24 hr capsule    Sig: Take 1 capsule (20 mg total) by mouth daily.    Dispense:  30 capsule    Refill:  0    Fill for november   amphetamine-dextroamphetamine (ADDERALL XR) 20 MG 24 hr capsule    Sig: Take 1 capsule (20 mg total) by mouth daily.    Dispense:  30 capsule    Refill:  0    Fill for december   amphetamine-dextroamphetamine (ADDERALL) 10 MG tablet    Sig: Take 1 tablet (10 mg total) by mouth daily at 12 noon.    Dispense:  30 tablet    Refill:  0    This dose is in additional to the adderal XR dose. Fill for november   amphetamine-dextroamphetamine (ADDERALL) 10 MG tablet    Sig: Take 1 tablet (10 mg total) by mouth daily at 12 noon.    Dispense:  30 tablet    Refill:  0    This dose is in additional to the adderal XR dose. Fill for December   sildenafil (VIAGRA) 100 MG tablet    Sig: TAKE 1/2 TO 1 TABLET(50 TO 100 MG) BY MOUTH DAILY AS NEEDED FOR ERECTILE DYSFUNCTION    Dispense:  10 tablet    Refill:  5    Return in about 3 months (around 02/09/2023) for F/U, ADHD med check, [place] Osentoski PCP.   Total time spent:30 Minutes Time  spent includes review of chart, medications, test results, and follow up plan  with the patient.   [place] Controlled Substance Database was reviewed by me.  This patient was seen by Sallyanne Kuster, FNP-C in collaboration with Dr. Beverely Risen as a part of collaborative care agreement.   Idolina Mantell R. Tedd Sias, MSN, FNP-C Internal medicine

## 2022-11-16 NOTE — Telephone Encounter (Signed)
Lvm to schedule today's follow up-Toni

## 2022-11-17 ENCOUNTER — Telehealth: Payer: Self-pay | Admitting: Nurse Practitioner

## 2022-11-17 ENCOUNTER — Encounter: Payer: Self-pay | Admitting: Nurse Practitioner

## 2022-11-17 DIAGNOSIS — Z9109 Other allergy status, other than to drugs and biological substances: Secondary | ICD-10-CM | POA: Insufficient documentation

## 2022-11-17 DIAGNOSIS — F902 Attention-deficit hyperactivity disorder, combined type: Secondary | ICD-10-CM | POA: Insufficient documentation

## 2022-11-17 NOTE — Telephone Encounter (Signed)
Left another vm to schedule 3 month follow up-Toni

## 2022-12-05 ENCOUNTER — Other Ambulatory Visit: Payer: Self-pay | Admitting: Nurse Practitioner

## 2022-12-05 DIAGNOSIS — Z9109 Other allergy status, other than to drugs and biological substances: Secondary | ICD-10-CM

## 2023-02-19 ENCOUNTER — Telehealth: Payer: Self-pay | Admitting: Nurse Practitioner

## 2023-02-19 NOTE — Telephone Encounter (Signed)
Lvm & sent mychart msg to move 02/23/23 appointment-Joseph Ochoa

## 2023-02-21 ENCOUNTER — Telehealth: Payer: Self-pay | Admitting: Nurse Practitioner

## 2023-02-21 DIAGNOSIS — F902 Attention-deficit hyperactivity disorder, combined type: Secondary | ICD-10-CM

## 2023-02-22 MED ORDER — AMPHETAMINE-DEXTROAMPHET ER 20 MG PO CP24
20.0000 mg | ORAL_CAPSULE | Freq: Every day | ORAL | 0 refills | Status: DC
Start: 2023-02-22 — End: 2023-03-02

## 2023-02-22 NOTE — Telephone Encounter (Signed)
Left message for patient to give office a call back

## 2023-02-23 ENCOUNTER — Ambulatory Visit: Payer: BC Managed Care – PPO | Admitting: Nurse Practitioner

## 2023-02-28 NOTE — Telephone Encounter (Signed)
Patient has an appointment on Friday.

## 2023-03-02 ENCOUNTER — Ambulatory Visit: Payer: BC Managed Care – PPO | Admitting: Nurse Practitioner

## 2023-03-02 ENCOUNTER — Encounter: Payer: Self-pay | Admitting: Nurse Practitioner

## 2023-03-02 VITALS — BP 130/88 | HR 88 | Temp 98.1°F | Resp 16 | Ht 70.0 in | Wt 207.8 lb

## 2023-03-02 DIAGNOSIS — E559 Vitamin D deficiency, unspecified: Secondary | ICD-10-CM | POA: Diagnosis not present

## 2023-03-02 DIAGNOSIS — F902 Attention-deficit hyperactivity disorder, combined type: Secondary | ICD-10-CM | POA: Diagnosis not present

## 2023-03-02 DIAGNOSIS — E782 Mixed hyperlipidemia: Secondary | ICD-10-CM

## 2023-03-02 DIAGNOSIS — S3994XA Unspecified injury of external genitals, initial encounter: Secondary | ICD-10-CM

## 2023-03-02 MED ORDER — AMPHETAMINE-DEXTROAMPHETAMINE 10 MG PO TABS: 10.0000 mg | ORAL_TABLET | Freq: Every day | ORAL | 0 refills | Status: DC

## 2023-03-02 MED ORDER — AMPHETAMINE-DEXTROAMPHET ER 20 MG PO CP24: 20.0000 mg | ORAL_CAPSULE | Freq: Every day | ORAL | 0 refills | Status: DC
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
## 2023-03-06 ENCOUNTER — Encounter: Payer: Self-pay | Admitting: Nurse Practitioner

## 2023-04-04 ENCOUNTER — Other Ambulatory Visit: Payer: Self-pay | Admitting: Nurse Practitioner

## 2023-04-04 DIAGNOSIS — Z9109 Other allergy status, other than to drugs and biological substances: Secondary | ICD-10-CM

## 2023-05-25 ENCOUNTER — Telehealth: Payer: Self-pay | Admitting: Nurse Practitioner

## 2023-05-25 NOTE — Telephone Encounter (Signed)
 Left vm and sent mychart message to confirm 06/01/23 appointment-Toni

## 2023-06-01 ENCOUNTER — Encounter: Payer: Self-pay | Admitting: Nurse Practitioner

## 2023-06-01 ENCOUNTER — Ambulatory Visit (INDEPENDENT_AMBULATORY_CARE_PROVIDER_SITE_OTHER): Payer: BC Managed Care – PPO | Admitting: Nurse Practitioner

## 2023-06-01 VITALS — BP 140/84 | HR 99 | Temp 98.7°F | Resp 16 | Ht 70.0 in | Wt 205.0 lb

## 2023-06-01 DIAGNOSIS — F902 Attention-deficit hyperactivity disorder, combined type: Secondary | ICD-10-CM

## 2023-06-01 DIAGNOSIS — R0989 Other specified symptoms and signs involving the circulatory and respiratory systems: Secondary | ICD-10-CM

## 2023-06-01 DIAGNOSIS — M5432 Sciatica, left side: Secondary | ICD-10-CM | POA: Diagnosis not present

## 2023-06-01 DIAGNOSIS — M25552 Pain in left hip: Secondary | ICD-10-CM | POA: Diagnosis not present

## 2023-06-01 DIAGNOSIS — Z0001 Encounter for general adult medical examination with abnormal findings: Secondary | ICD-10-CM

## 2023-06-01 DIAGNOSIS — G8929 Other chronic pain: Secondary | ICD-10-CM

## 2023-06-01 MED ORDER — AMPHETAMINE-DEXTROAMPHETAMINE 10 MG PO TABS: 10.0000 mg | ORAL_TABLET | Freq: Every day | ORAL | 0 refills | Status: DC

## 2023-06-01 MED ORDER — MELOXICAM 15 MG PO TABS
15.0000 mg | ORAL_TABLET | Freq: Every day | ORAL | 1 refills | Status: DC
Start: 2023-06-01 — End: 2023-09-07

## 2023-06-01 MED ORDER — TIZANIDINE HCL 4 MG PO TABS: 4.0000 mg | ORAL_TABLET | Freq: Four times a day (QID) | ORAL | 2 refills | Status: DC | PRN

## 2023-06-01 MED ORDER — AMPHETAMINE-DEXTROAMPHET ER 20 MG PO CP24: 20.0000 mg | ORAL_CAPSULE | Freq: Every day | ORAL | 0 refills | Status: DC

## 2023-06-01 MED ORDER — AMPHETAMINE-DEXTROAMPHET ER 20 MG PO CP24
20.0000 mg | ORAL_CAPSULE | Freq: Every day | ORAL | 0 refills | Status: DC
Start: 2023-06-01 — End: 2023-09-07

## 2023-06-01 NOTE — Progress Notes (Signed)
 [facility] [address] [place], [place] [address]  Internal MEDICINE  Office Visit Note  Patient Name: Joseph Ochoa  [id number]  [id number]  Date of Service: 06/01/2023  Chief Complaint  Patient presents with   Annual Exam    HPI Adalid presents for an annual well visit and physical exam.  Well-appearing 38 y.o. male with  Labs: labs ordered in January  New or worsening pain: still having pain of the penis, and having worsening sciatica.  Other concerns: Left hip pain off and on x6 months with left sided sciatica Reports continued pain of the penis and testes. Patient has already established with urology. Recommended that patient call the urologist to schedule appt soon.     Current Medication: Outpatient Encounter Medications as of 06/01/2023  Medication Sig   ALLEGRA-D ALLERGY & CONGESTION 60-120 MG 12 hr tablet Take 1 tablet by mouth twice daily.   gabapentin  (NEURONTIN ) 100 MG capsule Take 1-3 capsules nightly   ibuprofen  (ADVIL ) 600 MG tablet Take 1 tablet (600 mg total) by mouth every 6 (six) hours as needed.   mupirocin  cream (BACTROBAN ) 2 % Apply 1 Application topically 2 (two) times daily. To affected area until healed.   sildenafil  (VIAGRA ) 100 MG tablet TAKE 1/2 TO 1 TABLET(50 TO 100 MG) BY MOUTH DAILY AS NEEDED FOR ERECTILE DYSFUNCTION   tiZANidine (ZANAFLEX) 4 MG tablet Take 1 tablet (4 mg total) by mouth every 6 (six) hours as needed for muscle spasms.   valACYclovir  (VALTREX ) 1000 MG tablet TAKE 2 TABLETS BY MOUTH EVERY 12 HOURS during outbreak   [DISCONTINUED] amphetamine -dextroamphetamine  (ADDERALL XR) 20 MG 24 hr capsule Take 1 capsule (20 mg total) by mouth daily.   [DISCONTINUED] amphetamine -dextroamphetamine  (ADDERALL XR) 20 MG 24 hr capsule Take 1 capsule (20 mg total) by mouth daily.   [DISCONTINUED] amphetamine -dextroamphetamine  (ADDERALL XR) 20 MG 24 hr capsule Take 1 capsule (20 mg total) by mouth daily.   [DISCONTINUED]  amphetamine -dextroamphetamine  (ADDERALL) 10 MG tablet Take 1 tablet (10 mg total) by mouth daily at 12 noon.   [DISCONTINUED] amphetamine -dextroamphetamine  (ADDERALL) 10 MG tablet Take 1 tablet (10 mg total) by mouth daily at 12 noon.   [DISCONTINUED] amphetamine -dextroamphetamine  (ADDERALL) 10 MG tablet Take 1 tablet (10 mg total) by mouth daily at 12 noon.   [DISCONTINUED] cyclobenzaprine  (FLEXERIL ) 10 MG tablet Take 1 tablet (10 mg total) by mouth at bedtime.   [DISCONTINUED] meloxicam  (MOBIC ) 15 MG tablet Take 1 tablet (15 mg total) by mouth daily. In am with breakfast   [START ON 07/27/2023] amphetamine -dextroamphetamine  (ADDERALL XR) 20 MG 24 hr capsule Take 1 capsule (20 mg total) by mouth daily.   [START ON 06/29/2023] amphetamine -dextroamphetamine  (ADDERALL XR) 20 MG 24 hr capsule Take 1 capsule (20 mg total) by mouth daily.   amphetamine -dextroamphetamine  (ADDERALL XR) 20 MG 24 hr capsule Take 1 capsule (20 mg total) by mouth daily.   [START ON 07/27/2023] amphetamine -dextroamphetamine  (ADDERALL) 10 MG tablet Take 1 tablet (10 mg total) by mouth daily at 12 noon.   [START ON 06/29/2023] amphetamine -dextroamphetamine  (ADDERALL) 10 MG tablet Take 1 tablet (10 mg total) by mouth daily at 12 noon.   amphetamine -dextroamphetamine  (ADDERALL) 10 MG tablet Take 1 tablet (10 mg total) by mouth daily at 12 noon.   meloxicam  (MOBIC ) 15 MG tablet Take 1 tablet (15 mg total) by mouth daily. In am with breakfast   No facility-administered encounter medications on file as of 06/01/2023.    Surgical History: Past Surgical History:  Procedure Laterality Date  INGUINAL HERNIA REPAIR Left    LYMPHANGIOMA EXCISION Right 2001   on right shoulder    Medical History: Past Medical History:  Diagnosis Date   Anxiety    Lyme disease     Family History: Family History  Problem Relation Age of Onset   Hyperlipidemia Mother    Epilepsy Mother    Hypertension Father    Prostate cancer Father    Diabetes  Maternal Grandfather    Glaucoma Maternal Grandfather    Arthritis Maternal Grandfather    Hypertension Maternal Grandfather    Prostate cancer Maternal Grandfather    Hypertension Paternal Grandfather    Prostate cancer Paternal Grandfather     Social History   Socioeconomic History   Marital status: Married    Spouse name: Not on file   Number of children: Not on file   Years of education: Not on file   Highest education level: Not on file  Occupational History   Not on file  Tobacco Use   Smoking status: Former    Current packs/day: 0.00    Types: Cigarettes    Quit date: 06/30/2016    Years since quitting: 6.9   Smokeless tobacco: Never  Vaping Use   Vaping status: Never Used  Substance and Sexual Activity   Alcohol use: Yes    Comment: socially   Drug use: Never   Sexual activity: Yes  Other Topics Concern   Not on file  Social History Narrative   Not on file   Social Drivers of Health   Financial Resource Strain: Not on file  Food Insecurity: Not on file  Transportation Needs: Not on file  Physical Activity: Not on file  Stress: Not on file  Social Connections: Not on file  Intimate Partner Violence: Not on file      Review of Systems  Constitutional:  Negative for activity change, appetite change, chills, fatigue, fever and unexpected weight change.  HENT: Negative.  Negative for congestion, ear pain, rhinorrhea, sore throat and trouble swallowing.   Eyes: Negative.   Respiratory: Negative.  Negative for cough, chest tightness, shortness of breath and wheezing.   Cardiovascular: Negative.  Negative for chest pain and palpitations.  Gastrointestinal: Negative.  Negative for abdominal pain, blood in stool, constipation, diarrhea, nausea and vomiting.  Endocrine: Negative.   Genitourinary: Negative.  Negative for difficulty urinating, dysuria, frequency, hematuria and urgency.  Musculoskeletal: Negative.  Negative for arthralgias, back pain, joint  swelling, myalgias and neck pain.  Skin: Negative.  Negative for rash and wound.  Allergic/Immunologic: Negative.  Negative for immunocompromised state.  Neurological: Negative.  Negative for dizziness, seizures, numbness and headaches.  Hematological: Negative.   Psychiatric/Behavioral:  Positive for decreased concentration and sleep disturbance. Negative for behavioral problems, self-injury and suicidal ideas. The patient is nervous/anxious.     Vital Signs: BP (!) 140/84   Pulse 99   Temp 98.7 F (37.1 C)   Resp 16   Ht 5\' 10"  (1.778 m)   Wt 205 lb (93 kg)   SpO2 99%   BMI 29.41 kg/m    Physical Exam Vitals reviewed.  Constitutional:      General: He is awake. He is not in acute distress.    Appearance: Normal appearance. He is well-developed and well-groomed. He is obese. He is not ill-appearing or diaphoretic.  HENT:     Head: Normocephalic and atraumatic.     Right Ear: Tympanic membrane, ear canal and external ear normal.     Left  Ear: Tympanic membrane, ear canal and external ear normal.     Nose: Nose normal.     Mouth/Throat:     Lips: Pink.     Mouth: Mucous membranes are moist.     Pharynx: Oropharynx is clear. Uvula midline. No oropharyngeal exudate or posterior oropharyngeal erythema.  Eyes:     General: Lids are normal. Vision grossly intact. Gaze aligned appropriately. No scleral icterus.       Right eye: No discharge.        Left eye: No discharge.     Extraocular Movements: Extraocular movements intact.     Conjunctiva/sclera: Conjunctivae normal.     Pupils: Pupils are equal, round, and reactive to light.  Neck:     Thyroid : No thyromegaly.     Vascular: No JVD.     Trachea: Trachea and phonation normal. No tracheal deviation.  Cardiovascular:     Rate and Rhythm: Normal rate and regular rhythm.     Pulses: Normal pulses.     Heart sounds: Normal heart sounds, S1 normal and S2 normal. No murmur heard.    No friction rub. No gallop.     Comments:  Abdominal aortic pulsation with bruit and thrill in the upper abdomen above the umbilicus by a few inches.  Pulmonary:     Effort: Pulmonary effort is normal. No accessory muscle usage or respiratory distress.     Breath sounds: Normal breath sounds. No stridor. No wheezing or rales.  Chest:     Chest wall: No tenderness.  Abdominal:     General: Bowel sounds are normal. There is abdominal bruit (and abdominal aortic thrill). There is no distension.     Palpations: Abdomen is soft. There is pulsatile mass (abdominal aortic pulsation when auscultating abdomen.). There is no shifting dullness, fluid wave or mass.     Tenderness: There is no abdominal tenderness. There is no guarding or rebound.     Hernia: No hernia is present.  Musculoskeletal:        General: No tenderness or deformity. Normal range of motion.     Cervical back: Normal range of motion and neck supple.     Right lower leg: No edema.     Left lower leg: No edema.  Lymphadenopathy:     Cervical: No cervical adenopathy.  Skin:    General: Skin is warm and dry.     Capillary Refill: Capillary refill takes less than 2 seconds.     Coloration: Skin is not pale.     Findings: No erythema or rash.  Neurological:     Mental Status: He is alert and oriented to person, place, and time.     Cranial Nerves: No cranial nerve deficit.     Motor: No abnormal muscle tone.     Coordination: Coordination normal.     Gait: Gait normal.     Deep Tendon Reflexes: Reflexes are normal and symmetric.  Psychiatric:        Attention and Perception: He is inattentive.        Mood and Affect: Mood normal.        Speech: Speech is rapid and pressured.        Behavior: Behavior is hyperactive. Behavior is cooperative.        Thought Content: Thought content normal. Thought content is not paranoid. Thought content does not include homicidal or suicidal ideation.        Cognition and Memory: He exhibits impaired recent memory.  Judgment:  Judgment normal.        Assessment/Plan: 1. Encounter for routine adult health examination with abnormal findings (Primary) Age-appropriate preventive screenings and vaccinations discussed, annual physical exam completed. Routine labs for health maintenance previously ordered, reminded patient to have his labs drawn. PHM updated.    2. Abdominal aortic pulsation Ultrasound ordered to evaluate for AAA - US  AORTA DUPLEX COMPLETE; Future  3. Chronic left hip pain Physical therapy referral ordered. Take meloxicam  and tizanidine as prescribed.  - Ambulatory referral to Physical Therapy - meloxicam  (MOBIC ) 15 MG tablet; Take 1 tablet (15 mg total) by mouth daily. In am with breakfast  Dispense: 30 tablet; Refill: 1 - tiZANidine (ZANAFLEX) 4 MG tablet; Take 1 tablet (4 mg total) by mouth every 6 (six) hours as needed for muscle spasms.  Dispense: 60 tablet; Refill: 2  4. Left sided sciatica Physical therapy referral ordered. Take meloxicam  and tizanidine as prescribed.  - Ambulatory referral to Physical Therapy - meloxicam  (MOBIC ) 15 MG tablet; Take 1 tablet (15 mg total) by mouth daily. In am with breakfast  Dispense: 30 tablet; Refill: 1 - tiZANidine (ZANAFLEX) 4 MG tablet; Take 1 tablet (4 mg total) by mouth every 6 (six) hours as needed for muscle spasms.  Dispense: 60 tablet; Refill: 2  5. ADHD (attention deficit hyperactivity disorder), combined type Continue adderall as prescribed, follow up in 3 months for additional refills. UDS due at next visit  - amphetamine -dextroamphetamine  (ADDERALL) 10 MG tablet; Take 1 tablet (10 mg total) by mouth daily at 12 noon.  Dispense: 30 tablet; Refill: 0 - amphetamine -dextroamphetamine  (ADDERALL XR) 20 MG 24 hr capsule; Take 1 capsule (20 mg total) by mouth daily.  Dispense: 30 capsule; Refill: 0 - amphetamine -dextroamphetamine  (ADDERALL) 10 MG tablet; Take 1 tablet (10 mg total) by mouth daily at 12 noon.  Dispense: 30 tablet; Refill: 0 -  amphetamine -dextroamphetamine  (ADDERALL XR) 20 MG 24 hr capsule; Take 1 capsule (20 mg total) by mouth daily.  Dispense: 30 capsule; Refill: 0 - amphetamine -dextroamphetamine  (ADDERALL XR) 20 MG 24 hr capsule; Take 1 capsule (20 mg total) by mouth daily.  Dispense: 30 capsule; Refill: 0 - amphetamine -dextroamphetamine  (ADDERALL) 10 MG tablet; Take 1 tablet (10 mg total) by mouth daily at 12 noon.  Dispense: 30 tablet; Refill: 0     General Counseling: nicklous florczak understanding of the findings of todays visit and agrees with plan of treatment. I have discussed any further diagnostic evaluation that may be needed or ordered today. We also reviewed his medications today. he has been encouraged to call the office with any questions or concerns that should arise related to todays visit.    Orders Placed This Encounter  Procedures   US  AORTA DUPLEX COMPLETE   Ambulatory referral to Physical Therapy    Meds ordered this encounter  Medications   amphetamine -dextroamphetamine  (ADDERALL) 10 MG tablet    Sig: Take 1 tablet (10 mg total) by mouth daily at 12 noon.    Dispense:  30 tablet    Refill:  0    This dose is in additional to the adderal XR dose. Fill for June 27   amphetamine -dextroamphetamine  (ADDERALL XR) 20 MG 24 hr capsule    Sig: Take 1 capsule (20 mg total) by mouth daily.    Dispense:  30 capsule    Refill:  0    Fill for June 27   amphetamine -dextroamphetamine  (ADDERALL) 10 MG tablet    Sig: Take 1 tablet (10 mg total) by mouth  daily at 12 noon.    Dispense:  30 tablet    Refill:  0    This dose is in additional to the adderal XR dose. Fill for may 30   amphetamine -dextroamphetamine  (ADDERALL XR) 20 MG 24 hr capsule    Sig: Take 1 capsule (20 mg total) by mouth daily.    Dispense:  30 capsule    Refill:  0    Fill for may 30   amphetamine -dextroamphetamine  (ADDERALL XR) 20 MG 24 hr capsule    Sig: Take 1 capsule (20 mg total) by mouth daily.    Dispense:  30  capsule    Refill:  0    Fill for may 2   amphetamine -dextroamphetamine  (ADDERALL) 10 MG tablet    Sig: Take 1 tablet (10 mg total) by mouth daily at 12 noon.    Dispense:  30 tablet    Refill:  0    This dose is in additional to the adderal XR dose. Fill for may 2   meloxicam  (MOBIC ) 15 MG tablet    Sig: Take 1 tablet (15 mg total) by mouth daily. In am with breakfast    Dispense:  30 tablet    Refill:  1   tiZANidine (ZANAFLEX) 4 MG tablet    Sig: Take 1 tablet (4 mg total) by mouth every 6 (six) hours as needed for muscle spasms.    Dispense:  60 tablet    Refill:  2    Return in about 3 months (around 09/01/2023) for F/U, ADHD med check, Halayna Blane PCP and need follow up earlier for US  results .   Total time spent:30 Minutes Time spent includes review of chart, medications, test results, and follow up plan with the patient.   [place] Controlled Substance Database was reviewed by me.  This patient was seen by Laurence Pons, FNP-C in collaboration with Dr. Verneta Gone as a part of collaborative care agreement.  Yamileth Hayse R. Bobbi Burow, MSN, FNP-C Internal medicine

## 2023-06-04 ENCOUNTER — Telehealth: Payer: Self-pay | Admitting: Nurse Practitioner

## 2023-06-04 ENCOUNTER — Encounter: Payer: Self-pay | Admitting: Nurse Practitioner

## 2023-06-04 NOTE — Telephone Encounter (Signed)
 PT referral faxed to [facility]; [phone] Lvm notifying patient. Gave pt telephone [phone]

## 2023-06-05 ENCOUNTER — Other Ambulatory Visit: Payer: Self-pay | Admitting: Nurse Practitioner

## 2023-06-05 DIAGNOSIS — R0989 Other specified symptoms and signs involving the circulatory and respiratory systems: Secondary | ICD-10-CM

## 2023-06-05 NOTE — Progress Notes (Unsigned)
 Patient is concerned and would like to expedite the triple A ultrasound so I am changing the order to stat. He will also need a sooner appt to discuss results.

## 2023-06-06 ENCOUNTER — Telehealth: Payer: Self-pay

## 2023-06-06 ENCOUNTER — Ambulatory Visit
Admission: RE | Admit: 2023-06-06 | Discharge: 2023-06-06 | Disposition: A | Discharge status: Home / Self Care | Source: Ambulatory Visit | Attending: Nurse Practitioner | Admitting: Nurse Practitioner

## 2023-06-06 ENCOUNTER — Other Ambulatory Visit

## 2023-06-06 DIAGNOSIS — M4722 Other spondylosis with radiculopathy, cervical region: Secondary | ICD-10-CM | POA: Diagnosis not present

## 2023-06-06 DIAGNOSIS — R0989 Other specified symptoms and signs involving the circulatory and respiratory systems: Secondary | ICD-10-CM | POA: Diagnosis not present

## 2023-06-06 DIAGNOSIS — Z136 Encounter for screening for cardiovascular disorders: Secondary | ICD-10-CM | POA: Diagnosis not present

## 2023-06-06 NOTE — Telephone Encounter (Signed)
-----   Message from [facility] sent at 06/06/2023 10:46 AM EDT ----- No abdominal aortic aneurysm found on ultrasound.  Abdominal aorta and iliac arteries are normal.

## 2023-06-06 NOTE — Telephone Encounter (Signed)
 Patient notified

## 2023-06-07 ENCOUNTER — Other Ambulatory Visit

## 2023-06-08 DIAGNOSIS — M25552 Pain in left hip: Secondary | ICD-10-CM | POA: Diagnosis not present

## 2023-06-08 DIAGNOSIS — M545 Low back pain, unspecified: Secondary | ICD-10-CM | POA: Diagnosis not present

## 2023-06-11 ENCOUNTER — Telehealth: Payer: Self-pay | Admitting: Nurse Practitioner

## 2023-06-11 NOTE — Telephone Encounter (Signed)
 PT plan of care signed. Faxed back to [facility]; [phone]. Scanned-Toni

## 2023-06-15 DIAGNOSIS — M545 Low back pain, unspecified: Secondary | ICD-10-CM | POA: Diagnosis not present

## 2023-06-15 DIAGNOSIS — M25552 Pain in left hip: Secondary | ICD-10-CM | POA: Diagnosis not present

## 2023-06-27 ENCOUNTER — Ambulatory Visit: Admitting: Nurse Practitioner

## 2023-06-29 DIAGNOSIS — M25552 Pain in left hip: Secondary | ICD-10-CM | POA: Diagnosis not present

## 2023-06-29 DIAGNOSIS — M545 Low back pain, unspecified: Secondary | ICD-10-CM | POA: Diagnosis not present

## 2023-07-02 ENCOUNTER — Ambulatory Visit: Admitting: Nurse Practitioner

## 2023-07-06 DIAGNOSIS — M545 Low back pain, unspecified: Secondary | ICD-10-CM | POA: Diagnosis not present

## 2023-07-06 DIAGNOSIS — M25552 Pain in left hip: Secondary | ICD-10-CM | POA: Diagnosis not present

## 2023-09-05 ENCOUNTER — Other Ambulatory Visit: Payer: Self-pay | Admitting: Nurse Practitioner

## 2023-09-05 DIAGNOSIS — F902 Attention-deficit hyperactivity disorder, combined type: Secondary | ICD-10-CM

## 2023-09-05 NOTE — Telephone Encounter (Signed)
 Please refused pt need appt

## 2023-09-07 ENCOUNTER — Encounter: Payer: Self-pay | Admitting: Nurse Practitioner

## 2023-09-07 ENCOUNTER — Ambulatory Visit (INDEPENDENT_AMBULATORY_CARE_PROVIDER_SITE_OTHER): Admitting: Nurse Practitioner

## 2023-09-07 VITALS — BP 138/88 | HR 83 | Temp 98.5°F | Resp 16 | Ht 70.0 in | Wt 204.4 lb

## 2023-09-07 DIAGNOSIS — F902 Attention-deficit hyperactivity disorder, combined type: Secondary | ICD-10-CM

## 2023-09-07 DIAGNOSIS — N529 Male erectile dysfunction, unspecified: Secondary | ICD-10-CM

## 2023-09-07 DIAGNOSIS — G8929 Other chronic pain: Secondary | ICD-10-CM

## 2023-09-07 DIAGNOSIS — M25552 Pain in left hip: Secondary | ICD-10-CM | POA: Diagnosis not present

## 2023-09-07 DIAGNOSIS — D229 Melanocytic nevi, unspecified: Secondary | ICD-10-CM

## 2023-09-07 DIAGNOSIS — L731 Pseudofolliculitis barbae: Secondary | ICD-10-CM

## 2023-09-07 DIAGNOSIS — M5432 Sciatica, left side: Secondary | ICD-10-CM

## 2023-09-07 MED ORDER — TIZANIDINE HCL 4 MG PO TABS: 4.0000 mg | ORAL_TABLET | Freq: Four times a day (QID) | ORAL | 2 refills | Status: AC | PRN

## 2023-09-07 MED ORDER — AMPHETAMINE-DEXTROAMPHETAMINE 10 MG PO TABS: 10.0000 mg | ORAL_TABLET | Freq: Every day | ORAL | 0 refills | Status: DC

## 2023-09-07 MED ORDER — AMPHETAMINE-DEXTROAMPHET ER 20 MG PO CP24: 20.0000 mg | ORAL_CAPSULE | Freq: Every day | ORAL | 0 refills | Status: DC

## 2023-09-07 MED ORDER — MELOXICAM 15 MG PO TABS: 15.0000 mg | ORAL_TABLET | Freq: Every day | ORAL | 1 refills | Status: AC | PRN

## 2023-09-07 MED ORDER — IBUPROFEN 600 MG PO TABS: 600.0000 mg | ORAL_TABLET | Freq: Four times a day (QID) | ORAL | 0 refills | Status: AC | PRN

## 2023-09-07 MED ORDER — SILDENAFIL CITRATE 100 MG PO TABS: ORAL_TABLET | ORAL | 5 refills | Status: DC

## 2023-09-07 NOTE — Progress Notes (Signed)
 [facility] [address] [place], [place] [address]  Internal MEDICINE  Office Visit Note  Patient Name: Joseph Ochoa  [id number]  [id number]  Date of Service: 09/07/2023  Chief Complaint  Patient presents with   Follow-up    HPI Joseph Ochoa presents for a follow-up visit for ADHD, ED, skin moles, left hip pain, sciatica and ingrown hair.  ADHD -- current dose remains effective. BP and heart rate are stable. Denies any palpitations or other adverse side effects of the medication. Due for refills.  Chronic left hip pain and left sided sciatica. -- takes meloxicam  or ibuprofen  as needed for pain which is effective. Also takes tizanidine  as needed. Due for refills.  Has a recurrent ingrown hair or epidermoid cyst on the right upper leg. Joseph Ochoa also has numerous skin moles and wants to establish with dermatology for TBSE.  ED -- takes viagra  as needed which helps.    Current Medication: Outpatient Encounter Medications as of 09/07/2023  Medication Sig   ALLEGRA-D ALLERGY & CONGESTION 60-120 MG 12 hr tablet Take 1 tablet by mouth twice daily.   amphetamine -dextroamphetamine  (ADDERALL XR) 20 MG 24 hr capsule Take 1 capsule (20 mg total) by mouth daily.   [START ON 10/05/2023] amphetamine -dextroamphetamine  (ADDERALL XR) 20 MG 24 hr capsule Take 1 capsule (20 mg total) by mouth daily.   [START ON 11/02/2023] amphetamine -dextroamphetamine  (ADDERALL XR) 20 MG 24 hr capsule Take 1 capsule (20 mg total) by mouth daily.   amphetamine -dextroamphetamine  (ADDERALL) 10 MG tablet Take 1 tablet (10 mg total) by mouth daily at 12 noon.   [START ON 10/05/2023] amphetamine -dextroamphetamine  (ADDERALL) 10 MG tablet Take 1 tablet (10 mg total) by mouth daily at 12 noon.   [START ON 11/02/2023] amphetamine -dextroamphetamine  (ADDERALL) 10 MG tablet Take 1 tablet (10 mg total) by mouth daily at 12 noon.   ibuprofen  (ADVIL ) 600 MG tablet Take 1 tablet (600 mg total) by mouth every 6 (six) hours as needed.    meloxicam  (MOBIC ) 15 MG tablet Take 1 tablet (15 mg total) by mouth daily as needed for pain. In am with breakfast   mupirocin  cream (BACTROBAN ) 2 % Apply 1 Application topically 2 (two) times daily. To affected area until healed.   sildenafil  (VIAGRA ) 100 MG tablet TAKE 1/2 TO 1 TABLET(50 TO 100 MG) BY MOUTH DAILY AS NEEDED FOR ERECTILE DYSFUNCTION   tiZANidine  (ZANAFLEX ) 4 MG tablet Take 1 tablet (4 mg total) by mouth every 6 (six) hours as needed for muscle spasms.   valACYclovir  (VALTREX ) 1000 MG tablet TAKE 2 TABLETS BY MOUTH EVERY 12 HOURS during outbreak   [DISCONTINUED] amphetamine -dextroamphetamine  (ADDERALL XR) 20 MG 24 hr capsule Take 1 capsule (20 mg total) by mouth daily.   [DISCONTINUED] amphetamine -dextroamphetamine  (ADDERALL XR) 20 MG 24 hr capsule Take 1 capsule (20 mg total) by mouth daily.   [DISCONTINUED] amphetamine -dextroamphetamine  (ADDERALL XR) 20 MG 24 hr capsule Take 1 capsule (20 mg total) by mouth daily.   [DISCONTINUED] amphetamine -dextroamphetamine  (ADDERALL) 10 MG tablet Take 1 tablet (10 mg total) by mouth daily at 12 noon.   [DISCONTINUED] amphetamine -dextroamphetamine  (ADDERALL) 10 MG tablet Take 1 tablet (10 mg total) by mouth daily at 12 noon.   [DISCONTINUED] amphetamine -dextroamphetamine  (ADDERALL) 10 MG tablet Take 1 tablet (10 mg total) by mouth daily at 12 noon.   [DISCONTINUED] gabapentin  (NEURONTIN ) 100 MG capsule Take 1-3 capsules nightly   [DISCONTINUED] ibuprofen  (ADVIL ) 600 MG tablet Take 1 tablet (600 mg total) by mouth every 6 (six) hours as needed.   [  DISCONTINUED] meloxicam  (MOBIC ) 15 MG tablet Take 1 tablet (15 mg total) by mouth daily. In am with breakfast   [DISCONTINUED] sildenafil  (VIAGRA ) 100 MG tablet TAKE 1/2 TO 1 TABLET(50 TO 100 MG) BY MOUTH DAILY AS NEEDED FOR ERECTILE DYSFUNCTION   [DISCONTINUED] tiZANidine  (ZANAFLEX ) 4 MG tablet Take 1 tablet (4 mg total) by mouth every 6 (six) hours as needed for muscle spasms.   No  facility-administered encounter medications on file as of 09/07/2023.    Surgical History: Past Surgical History:  Procedure Laterality Date   INGUINAL HERNIA REPAIR Left    LYMPHANGIOMA EXCISION Right 2001   on right shoulder    Medical History: Past Medical History:  Diagnosis Date   Anxiety    Lyme disease     Family History: Family History  Problem Relation Age of Onset   Hyperlipidemia Mother    Epilepsy Mother    Hypertension Father    Prostate cancer Father    Diabetes Maternal Grandfather    Glaucoma Maternal Grandfather    Arthritis Maternal Grandfather    Hypertension Maternal Grandfather    Prostate cancer Maternal Grandfather    Hypertension Paternal Grandfather    Prostate cancer Paternal Grandfather     Social History   Socioeconomic History   Marital status: Married    Spouse name: Not on file   Number of children: Not on file   Years of education: Not on file   Highest education level: Not on file  Occupational History   Not on file  Tobacco Use   Smoking status: Former    Current packs/day: 0.00    Types: Cigarettes    Quit date: 06/30/2016    Years since quitting: 7.1   Smokeless tobacco: Never  Vaping Use   Vaping status: Never Used  Substance and Sexual Activity   Alcohol use: Yes    Comment: socially   Drug use: Never   Sexual activity: Yes  Other Topics Concern   Not on file  Social History Narrative   Not on file   Social Drivers of Health   Financial Resource Strain: Not on file  Food Insecurity: Not on file  Transportation Needs: Not on file  Physical Activity: Not on file  Stress: Not on file  Social Connections: Not on file  Intimate Partner Violence: Not on file      Review of Systems  Constitutional:  Positive for fatigue. Negative for activity change, appetite change, chills, fever and unexpected weight change.  HENT: Negative.  Negative for congestion, ear pain, rhinorrhea, sore throat and trouble swallowing.    Eyes: Negative.   Respiratory: Negative.  Negative for cough, chest tightness, shortness of breath and wheezing.   Cardiovascular: Negative.  Negative for chest pain and palpitations.  Gastrointestinal: Negative.  Negative for abdominal pain, blood in stool, constipation, diarrhea, nausea and vomiting.  Endocrine: Negative.   Genitourinary: Negative.  Negative for difficulty urinating, dysuria, frequency, hematuria and urgency.  Musculoskeletal: Negative.  Negative for arthralgias, back pain, joint swelling, myalgias and neck pain.  Skin: Negative.  Negative for rash and wound.       Skin moles and an ingrown hair on the upper right leg.   Allergic/Immunologic: Negative.  Negative for immunocompromised state.  Neurological: Negative.  Negative for dizziness, seizures, numbness and headaches.  Hematological: Negative.   Psychiatric/Behavioral:  Positive for decreased concentration. Negative for behavioral problems, self-injury, sleep disturbance and suicidal ideas. The patient is nervous/anxious.     Vital Signs: BP 138/88  Pulse 83   Temp 98.5 F (36.9 C)   Resp 16   Ht 5' 10 (1.778 m)   Wt 204 lb 6.4 oz (92.7 kg)   SpO2 97%   BMI 29.33 kg/m    Physical Exam Vitals reviewed.  Constitutional:      General: Joseph Ochoa is not in acute distress.    Appearance: Normal appearance. Joseph Ochoa is not ill-appearing.  HENT:     Head: Normocephalic and atraumatic.  Eyes:     Pupils: Pupils are equal, round, and reactive to light.  Cardiovascular:     Rate and Rhythm: Normal rate and regular rhythm.  Pulmonary:     Effort: Pulmonary effort is normal. No respiratory distress.  Skin:    General: Skin is warm and dry.  Neurological:     Mental Status: Joseph Ochoa is alert and oriented to person, place, and time.  Psychiatric:        Mood and Affect: Mood normal.        Behavior: Behavior normal.        Assessment/Plan: 1. Chronic left hip pain (Primary) Continue prn meloxicam  and tizanidine  as  prescribed.  - ibuprofen  (ADVIL ) 600 MG tablet; Take 1 tablet (600 mg total) by mouth every 6 (six) hours as needed.  Dispense: 30 tablet; Refill: 0 - meloxicam  (MOBIC ) 15 MG tablet; Take 1 tablet (15 mg total) by mouth daily as needed for pain. In am with breakfast  Dispense: 30 tablet; Refill: 1 - tiZANidine  (ZANAFLEX ) 4 MG tablet; Take 1 tablet (4 mg total) by mouth every 6 (six) hours as needed for muscle spasms.  Dispense: 60 tablet; Refill: 2  2. Left sided sciatica Continue prn meloxicam  and tizanidine  as prescribed.  - ibuprofen  (ADVIL ) 600 MG tablet; Take 1 tablet (600 mg total) by mouth every 6 (six) hours as needed.  Dispense: 30 tablet; Refill: 0 - meloxicam  (MOBIC ) 15 MG tablet; Take 1 tablet (15 mg total) by mouth daily as needed for pain. In am with breakfast  Dispense: 30 tablet; Refill: 1 - tiZANidine  (ZANAFLEX ) 4 MG tablet; Take 1 tablet (4 mg total) by mouth every 6 (six) hours as needed for muscle spasms.  Dispense: 60 tablet; Refill: 2  3. Erectile dysfunction, unspecified erectile dysfunction type Continue prn viagra  as prescribed  - sildenafil  (VIAGRA ) 100 MG tablet; TAKE 1/2 TO 1 TABLET(50 TO 100 MG) BY MOUTH DAILY AS NEEDED FOR ERECTILE DYSFUNCTION  Dispense: 10 tablet; Refill: 5  4. Ingrown hair Referred to dermatology - Ambulatory referral to Dermatology  5. Numerous skin moles Referred to dermatology - Ambulatory referral to Dermatology  6. ADHD (attention deficit hyperactivity disorder), combined type Continue adderall as prescribed. Follow up in 3 months for additional refills, UDS due at next office visit.  - amphetamine -dextroamphetamine  (ADDERALL) 10 MG tablet; Take 1 tablet (10 mg total) by mouth daily at 12 noon.  Dispense: 30 tablet; Refill: 0 - amphetamine -dextroamphetamine  (ADDERALL XR) 20 MG 24 hr capsule; Take 1 capsule (20 mg total) by mouth daily.  Dispense: 30 capsule; Refill: 0 - amphetamine -dextroamphetamine  (ADDERALL) 10 MG tablet; Take 1  tablet (10 mg total) by mouth daily at 12 noon.  Dispense: 30 tablet; Refill: 0 - amphetamine -dextroamphetamine  (ADDERALL XR) 20 MG 24 hr capsule; Take 1 capsule (20 mg total) by mouth daily.  Dispense: 30 capsule; Refill: 0 - amphetamine -dextroamphetamine  (ADDERALL XR) 20 MG 24 hr capsule; Take 1 capsule (20 mg total) by mouth daily.  Dispense: 30 capsule; Refill: 0 - amphetamine -dextroamphetamine  (ADDERALL) 10 MG  tablet; Take 1 tablet (10 mg total) by mouth daily at 12 noon.  Dispense: 30 tablet; Refill: 0   General Counseling: Joseph Ochoa understanding of the findings of todays visit and agrees with plan of treatment. I have discussed any further diagnostic evaluation that may be needed or ordered today. We also reviewed his medications today. Joseph Ochoa has been encouraged to call the office with any questions or concerns that should arise related to todays visit.    Orders Placed This Encounter  Procedures   Ambulatory referral to Dermatology    Meds ordered this encounter  Medications   amphetamine -dextroamphetamine  (ADDERALL) 10 MG tablet    Sig: Take 1 tablet (10 mg total) by mouth daily at 12 noon.    Dispense:  30 tablet    Refill:  0    This dose is in additional to the adderal XR dose. Fill for August   amphetamine -dextroamphetamine  (ADDERALL XR) 20 MG 24 hr capsule    Sig: Take 1 capsule (20 mg total) by mouth daily.    Dispense:  30 capsule    Refill:  0    Fill for august   amphetamine -dextroamphetamine  (ADDERALL) 10 MG tablet    Sig: Take 1 tablet (10 mg total) by mouth daily at 12 noon.    Dispense:  30 tablet    Refill:  0    This dose is in additional to the adderal XR dose. Fill for september   amphetamine -dextroamphetamine  (ADDERALL XR) 20 MG 24 hr capsule    Sig: Take 1 capsule (20 mg total) by mouth daily.    Dispense:  30 capsule    Refill:  0    Fill for september   amphetamine -dextroamphetamine  (ADDERALL XR) 20 MG 24 hr capsule    Sig: Take 1 capsule (20  mg total) by mouth daily.    Dispense:  30 capsule    Refill:  0    Fill for october   amphetamine -dextroamphetamine  (ADDERALL) 10 MG tablet    Sig: Take 1 tablet (10 mg total) by mouth daily at 12 noon.    Dispense:  30 tablet    Refill:  0    This dose is in additional to the adderal XR dose. Fill for october   ibuprofen  (ADVIL ) 600 MG tablet    Sig: Take 1 tablet (600 mg total) by mouth every 6 (six) hours as needed.    Dispense:  30 tablet    Refill:  0    Patient uses ibuprofen  only when Joseph Ochoa is not using meloxicam    meloxicam  (MOBIC ) 15 MG tablet    Sig: Take 1 tablet (15 mg total) by mouth daily as needed for pain. In am with breakfast    Dispense:  30 tablet    Refill:  1   sildenafil  (VIAGRA ) 100 MG tablet    Sig: TAKE 1/2 TO 1 TABLET(50 TO 100 MG) BY MOUTH DAILY AS NEEDED FOR ERECTILE DYSFUNCTION    Dispense:  10 tablet    Refill:  5   tiZANidine  (ZANAFLEX ) 4 MG tablet    Sig: Take 1 tablet (4 mg total) by mouth every 6 (six) hours as needed for muscle spasms.    Dispense:  60 tablet    Refill:  2    Return in about 3 months (around 11/29/2023) for F/U, ADHD med check, Joseph Ochoa PCP.   Total time spent:30 Minutes Time spent includes review of chart, medications, test results, and follow up plan with the patient.   [place]  Controlled Substance Database was reviewed by me.  This patient was seen by Mardy Maxin, FNP-C in collaboration with Dr. Sigrid Bathe as a part of collaborative care agreement.   Nnamdi Dacus R. Maxin, MSN, FNP-C Internal medicine

## 2023-09-10 ENCOUNTER — Telehealth: Payer: Self-pay | Admitting: Nurse Practitioner

## 2023-09-10 ENCOUNTER — Ambulatory Visit: Payer: Self-pay | Admitting: Physician Assistant

## 2023-09-10 ENCOUNTER — Encounter: Payer: Self-pay | Admitting: Physician Assistant

## 2023-09-10 VITALS — BP 144/91 | HR 82 | Ht 69.0 in | Wt 205.0 lb

## 2023-09-10 DIAGNOSIS — N529 Male erectile dysfunction, unspecified: Secondary | ICD-10-CM | POA: Diagnosis not present

## 2023-09-10 DIAGNOSIS — Z8042 Family history of malignant neoplasm of prostate: Secondary | ICD-10-CM | POA: Diagnosis not present

## 2023-09-10 MED ORDER — TADALAFIL 20 MG PO TABS: ORAL_TABLET | ORAL | 5 refills | Status: AC

## 2023-09-10 NOTE — Progress Notes (Signed)
 09/10/2023 10:14 AM   Joseph Ochoa 1985-08-02 [id number]  CC: Chief Complaint  Patient presents with   Follow-up   HPI: Joseph Ochoa is a 38 y.o. male with PMH chronic scrotal pain on gabapentin , ED on sildenafil , and family history of prostate cancer who presents today for annual follow-up.   Today he reports no major health changes in the last year.  He has been taking demand dose sildenafil  for ED, but finds that the results are rather inconsistent and he tends to get a headache.  He wonders about alternative options.  He had an a.m. testosterone  back in 2022, which was normal at 447.  PMH: Past Medical History:  Diagnosis Date   Anxiety    Lyme disease     Surgical History: Past Surgical History:  Procedure Laterality Date   INGUINAL HERNIA REPAIR Left    LYMPHANGIOMA EXCISION Right 2001   on right shoulder    Home Medications:  Allergies as of 09/10/2023   No Known Allergies      Medication List        Accurate as of September 10, 2023 10:14 AM. If you have any questions, ask your nurse or doctor.          Allegra-D Allergy & Congestion 60-120 MG 12 hr tablet Generic drug: fexofenadine -pseudoephedrine Take 1 tablet by mouth twice daily.   amphetamine -dextroamphetamine  10 MG tablet Commonly known as: ADDERALL Take 1 tablet (10 mg total) by mouth daily at 12 noon.   amphetamine -dextroamphetamine  20 MG 24 hr capsule Commonly known as: ADDERALL XR Take 1 capsule (20 mg total) by mouth daily.   amphetamine -dextroamphetamine  10 MG tablet Commonly known as: ADDERALL Take 1 tablet (10 mg total) by mouth daily at 12 noon. Start taking on: October 05, 2023   amphetamine -dextroamphetamine  20 MG 24 hr capsule Commonly known as: ADDERALL XR Take 1 capsule (20 mg total) by mouth daily. Start taking on: October 05, 2023   amphetamine -dextroamphetamine  20 MG 24 hr capsule Commonly known as: ADDERALL XR Take 1 capsule (20 mg total) by mouth  daily. Start taking on: November 02, 2023   amphetamine -dextroamphetamine  10 MG tablet Commonly known as: ADDERALL Take 1 tablet (10 mg total) by mouth daily at 12 noon. Start taking on: November 02, 2023   ibuprofen  600 MG tablet Commonly known as: ADVIL  Take 1 tablet (600 mg total) by mouth every 6 (six) hours as needed.   meloxicam  15 MG tablet Commonly known as: MOBIC  Take 1 tablet (15 mg total) by mouth daily as needed for pain. In am with breakfast   mupirocin  cream 2 % Commonly known as: BACTROBAN  Apply 1 Application topically 2 (two) times daily. To affected area until healed.   sildenafil  100 MG tablet Commonly known as: VIAGRA  TAKE 1/2 TO 1 TABLET(50 TO 100 MG) BY MOUTH DAILY AS NEEDED FOR ERECTILE DYSFUNCTION   tiZANidine  4 MG tablet Commonly known as: ZANAFLEX  Take 1 tablet (4 mg total) by mouth every 6 (six) hours as needed for muscle spasms.   valACYclovir  1000 MG tablet Commonly known as: VALTREX  TAKE 2 TABLETS BY MOUTH EVERY 12 HOURS during outbreak        Allergies:  No Known Allergies  Family History: Family History  Problem Relation Age of Onset   Hyperlipidemia Mother    Epilepsy Mother    Hypertension Father    Prostate cancer Father    Diabetes Maternal Grandfather    Glaucoma Maternal Grandfather    Arthritis Maternal Grandfather  Hypertension Maternal Grandfather    Prostate cancer Maternal Grandfather    Hypertension Paternal Grandfather    Prostate cancer Paternal Grandfather     Social History:   reports that he quit smoking about 7 years ago. His smoking use included cigarettes. He has never used smokeless tobacco. He reports current alcohol use. He reports that he does not use drugs.  Physical Exam: BP (!) 144/91   Pulse 82   Ht 5' 9 (1.753 m)   Wt 205 lb (93 kg)   SpO2 100%   BMI 30.27 kg/m   Constitutional:  Alert and oriented, no acute distress, nontoxic appearing HEENT: [place], AT Cardiovascular: No clubbing, cyanosis, or  edema Respiratory: Normal respiratory effort, no increased work of breathing GU: Normal sphincter tone.  Smooth, symmetrically enlarged 30+ cc prostate without nodules or induration. Skin: No rashes, bruises or suspicious lesions Neurologic: Grossly intact, no focal deficits, moving all 4 extremities Psychiatric: Normal mood and affect  Assessment & Plan:   1. Family history of prostate cancer (Primary) DRE benign today.  PSA drawn, will contact with results.  Will continue with screening every 1 to 2 years given family history. - PSA; Future - PSA  2. Erectile dysfunction, unspecified erectile dysfunction type I offered him demand dose tadalafil  as an alternative.  We discussed the need to manually stimulate erections on this medicine.  With no significant changes in his health since his last normal PSA draw, I do not think we need to repeat this at this time. - tadalafil  (CIALIS ) 20 MG tablet; Take one tablet at least 60 minutes prior to sexual activity.  Dispense: 30 tablet; Refill: 5   Return in about 1 year (around 09/09/2024) for Annual SHIM/PSA/DRE + will call with results.  Lucie Hones, PA-C  [facility] [address] [place], [place] [address] [phone]

## 2023-09-10 NOTE — Telephone Encounter (Signed)
 Dermatology referral sent via [facility] to [facility].  Notified patient. Gave telephone # [phone]

## 2023-09-11 ENCOUNTER — Ambulatory Visit: Payer: Self-pay | Admitting: Physician Assistant

## 2023-09-11 LAB — PSA: Prostate Specific Ag, Serum: 0.7 ng/mL (ref 0.0–4.0)

## 2023-10-08 ENCOUNTER — Encounter: Payer: Self-pay | Admitting: Nurse Practitioner

## 2023-11-14 ENCOUNTER — Telehealth: Payer: Self-pay | Admitting: Nurse Practitioner

## 2023-11-14 NOTE — Telephone Encounter (Signed)
 Per [facility], referral has been closed due to patient not returning calls -Andree

## 2023-11-30 ENCOUNTER — Ambulatory Visit: Admitting: Nurse Practitioner

## 2023-11-30 ENCOUNTER — Encounter: Payer: Self-pay | Admitting: Nurse Practitioner

## 2023-11-30 VITALS — BP 138/88 | HR 89 | Temp 97.2°F | Resp 16 | Ht 69.0 in | Wt 207.4 lb

## 2023-11-30 DIAGNOSIS — M25512 Pain in left shoulder: Secondary | ICD-10-CM

## 2023-11-30 DIAGNOSIS — E782 Mixed hyperlipidemia: Secondary | ICD-10-CM | POA: Diagnosis not present

## 2023-11-30 DIAGNOSIS — E559 Vitamin D deficiency, unspecified: Secondary | ICD-10-CM

## 2023-11-30 DIAGNOSIS — R251 Tremor, unspecified: Secondary | ICD-10-CM | POA: Diagnosis not present

## 2023-11-30 DIAGNOSIS — F902 Attention-deficit hyperactivity disorder, combined type: Secondary | ICD-10-CM

## 2023-11-30 DIAGNOSIS — G8929 Other chronic pain: Secondary | ICD-10-CM

## 2023-11-30 DIAGNOSIS — Z630 Problems in relationship with spouse or partner: Secondary | ICD-10-CM

## 2023-11-30 DIAGNOSIS — D229 Melanocytic nevi, unspecified: Secondary | ICD-10-CM

## 2023-11-30 DIAGNOSIS — F411 Generalized anxiety disorder: Secondary | ICD-10-CM

## 2023-11-30 DIAGNOSIS — F321 Major depressive disorder, single episode, moderate: Secondary | ICD-10-CM

## 2023-11-30 MED ORDER — AMPHETAMINE-DEXTROAMPHETAMINE 10 MG PO TABS: 10.0000 mg | ORAL_TABLET | Freq: Every day | ORAL | 0 refills | Status: AC

## 2023-11-30 MED ORDER — AMPHETAMINE-DEXTROAMPHET ER 20 MG PO CP24: 20.0000 mg | ORAL_CAPSULE | Freq: Every day | ORAL | 0 refills | Status: AC

## 2023-11-30 MED ORDER — ESCITALOPRAM OXALATE 10 MG PO TABS
10.0000 mg | ORAL_TABLET | Freq: Every day | ORAL | 3 refills | Status: AC
Start: 2023-11-30 — End: ?

## 2023-11-30 NOTE — Progress Notes (Signed)
 [facility] [address] [place], [place] [address]  Internal MEDICINE  Office Visit Note  Patient Name: Joseph Ochoa  [id number]  [id number]  Date of Service: 11/30/2023  Chief Complaint  Patient presents with   Follow-up    HPI Joseph Ochoa presents for a follow-up visit for ADHD, anxiety, depression, and relationship problems.  ADHD -- current dose remains effective. BP and heart rate are stable. Denies any palpitations or other adverse side effects of the medication. Due for refills.  Anxiety -- anxiety has increased, see GAD7 below. He is having relationship problems with his spouse. He is moving out this weekend to give them both space. He is worried all the time and states that they are working on divorce but he mentioned also wanting to go to marriage counseling with his spouse and see if they can work it out.  Depression -- depressed mood, anhedonia, poor sleep, decreased appetite, decreased energy, trouble concentrating and feeling bad about himself and like he has let his family down. These problems are impacting his daily life and ability to work, communicate with others and complete simple and easy tasks. He does endorse that the separation from his spouse and possible impending divorce has significantly contributed to the worsening depressive symptoms. He is requesting counseling/therapy.  Numerous skin moles -- the initial referral was to an office that seems to do more med spa type procedures. He would like to change the referral to a different office.      11/30/2023    8:37 AM  Depression screen PHQ 2/9  Decreased Interest 1  Down, Depressed, Hopeless 2  PHQ - 2 Score 3  Altered sleeping 3  Tired, decreased energy 3  Change in appetite 2  Feeling bad or failure about yourself  3  Trouble concentrating 3  Moving slowly or fidgety/restless 2  Suicidal thoughts 0  PHQ-9 Score 19  Difficult doing work/chores Extremely dIfficult       11/30/2023     8:41 AM 08/19/2021   10:41 AM 06/17/2021   10:54 AM 05/20/2021    9:09 AM  GAD 7 : Generalized Anxiety Score  Nervous, Anxious, on Edge 3 0 2 0  Control/stop worrying 2 0 0 0  Worry too much - different things 2 0 1 0  Trouble relaxing 3 0 2 2  Restless 2 0 2 2  Easily annoyed or irritable 2 0 1 0  Afraid - awful might happen 0 0 0 0  Total GAD 7 Score 14 0 8 4  Anxiety Difficulty Very difficult Not difficult at all Not difficult at all       Current Medication: Outpatient Encounter Medications as of 11/30/2023  Medication Sig   escitalopram  (LEXAPRO ) 10 MG tablet Take 1 tablet (10 mg total) by mouth daily.   ALLEGRA-D ALLERGY & CONGESTION 60-120 MG 12 hr tablet Take 1 tablet by mouth twice daily.   [START ON 01/25/2024] amphetamine -dextroamphetamine  (ADDERALL XR) 20 MG 24 hr capsule Take 1 capsule (20 mg total) by mouth daily.   [START ON 12/28/2023] amphetamine -dextroamphetamine  (ADDERALL XR) 20 MG 24 hr capsule Take 1 capsule (20 mg total) by mouth daily.   amphetamine -dextroamphetamine  (ADDERALL XR) 20 MG 24 hr capsule Take 1 capsule (20 mg total) by mouth daily.   amphetamine -dextroamphetamine  (ADDERALL) 10 MG tablet Take 1 tablet (10 mg total) by mouth daily at 12 noon.   [START ON 01/25/2024] amphetamine -dextroamphetamine  (ADDERALL) 10 MG tablet Take 1 tablet (10 mg total) by mouth daily at  12 noon.   [START ON 12/28/2023] amphetamine -dextroamphetamine  (ADDERALL) 10 MG tablet Take 1 tablet (10 mg total) by mouth daily at 12 noon.   ibuprofen  (ADVIL ) 600 MG tablet Take 1 tablet (600 mg total) by mouth every 6 (six) hours as needed.   meloxicam  (MOBIC ) 15 MG tablet Take 1 tablet (15 mg total) by mouth daily as needed for pain. In am with breakfast   tadalafil  (CIALIS ) 20 MG tablet Take one tablet at least 60 minutes prior to sexual activity.   tiZANidine  (ZANAFLEX ) 4 MG tablet Take 1 tablet (4 mg total) by mouth every 6 (six) hours as needed for muscle spasms.   valACYclovir   (VALTREX ) 1000 MG tablet TAKE 2 TABLETS BY MOUTH EVERY 12 HOURS during outbreak   [DISCONTINUED] amphetamine -dextroamphetamine  (ADDERALL XR) 20 MG 24 hr capsule Take 1 capsule (20 mg total) by mouth daily.   [DISCONTINUED] amphetamine -dextroamphetamine  (ADDERALL XR) 20 MG 24 hr capsule Take 1 capsule (20 mg total) by mouth daily.   [DISCONTINUED] amphetamine -dextroamphetamine  (ADDERALL XR) 20 MG 24 hr capsule Take 1 capsule (20 mg total) by mouth daily.   [DISCONTINUED] amphetamine -dextroamphetamine  (ADDERALL) 10 MG tablet Take 1 tablet (10 mg total) by mouth daily at 12 noon.   [DISCONTINUED] amphetamine -dextroamphetamine  (ADDERALL) 10 MG tablet Take 1 tablet (10 mg total) by mouth daily at 12 noon.   [DISCONTINUED] amphetamine -dextroamphetamine  (ADDERALL) 10 MG tablet Take 1 tablet (10 mg total) by mouth daily at 12 noon.   [DISCONTINUED] mupirocin  cream (BACTROBAN ) 2 % Apply 1 Application topically 2 (two) times daily. To affected area until healed.   No facility-administered encounter medications on file as of 11/30/2023.    Surgical History: Past Surgical History:  Procedure Laterality Date   INGUINAL HERNIA REPAIR Left    LYMPHANGIOMA EXCISION Right 2001   on right shoulder    Medical History: Past Medical History:  Diagnosis Date   Anxiety    Lyme disease     Family History: Family History  Problem Relation Age of Onset   Hyperlipidemia Mother    Epilepsy Mother    Hypertension Father    Prostate cancer Father    Diabetes Maternal Grandfather    Glaucoma Maternal Grandfather    Arthritis Maternal Grandfather    Hypertension Maternal Grandfather    Prostate cancer Maternal Grandfather    Hypertension Paternal Grandfather    Prostate cancer Paternal Grandfather     Social History   Socioeconomic History   Marital status: Married    Spouse name: Not on file   Number of children: Not on file   Years of education: Not on file   Highest education level: Not on file   Occupational History   Not on file  Tobacco Use   Smoking status: Former    Current packs/day: 0.00    Types: Cigarettes    Quit date: 06/30/2016    Years since quitting: 7.4   Smokeless tobacco: Never  Vaping Use   Vaping status: Never Used  Substance and Sexual Activity   Alcohol use: Yes    Comment: socially   Drug use: Never   Sexual activity: Yes  Other Topics Concern   Not on file  Social History Narrative   Not on file   Social Drivers of Health   Financial Resource Strain: Not on file  Food Insecurity: Not on file  Transportation Needs: Not on file  Physical Activity: Not on file  Stress: Not on file  Social Connections: Not on file  Intimate Partner Violence:  Not on file      Review of Systems  Constitutional:  Positive for activity change, appetite change and fatigue. Negative for chills, fever and unexpected weight change.  HENT: Negative.  Negative for congestion, ear pain, rhinorrhea, sore throat and trouble swallowing.   Eyes: Negative.   Respiratory: Negative.  Negative for cough, chest tightness, shortness of breath and wheezing.   Cardiovascular: Negative.  Negative for chest pain and palpitations.  Gastrointestinal: Negative.  Negative for abdominal pain, blood in stool, constipation, diarrhea, nausea and vomiting.  Endocrine: Negative.   Genitourinary: Negative.  Negative for difficulty urinating, dysuria, frequency, hematuria and urgency.  Musculoskeletal:  Positive for arthralgias and back pain. Negative for joint swelling, myalgias and neck pain.  Skin: Negative.  Negative for rash and wound.       Skin moles and an ingrown hair on the upper right leg.   Allergic/Immunologic: Negative.  Negative for immunocompromised state.  Neurological:  Positive for tremors. Negative for dizziness, seizures, numbness and headaches.  Hematological: Negative.   Psychiatric/Behavioral:  Positive for behavioral problems, decreased concentration, dysphoric mood and  sleep disturbance. Negative for self-injury and suicidal ideas. The patient is nervous/anxious.     Vital Signs: BP 138/88   Pulse 89   Temp (!) 97.2 F (36.2 C)   Resp 16   Ht 5' 9 (1.753 m)   Wt 207 lb 6.4 oz (94.1 kg)   SpO2 99%   BMI 30.63 kg/m    Physical Exam Vitals reviewed.  Constitutional:      General: He is not in acute distress.    Appearance: Normal appearance. He is obese. He is not ill-appearing.  HENT:     Head: Normocephalic and atraumatic.  Eyes:     Pupils: Pupils are equal, round, and reactive to light.  Cardiovascular:     Rate and Rhythm: Normal rate and regular rhythm.  Pulmonary:     Effort: Pulmonary effort is normal. No respiratory distress.  Musculoskeletal:     Right shoulder: Normal.     Left shoulder: Decreased range of motion. Decreased strength.  Skin:    General: Skin is warm and dry.  Neurological:     Mental Status: He is alert and oriented to person, place, and time.     Motor: Tremor (both hands, mild, observed.) present.  Psychiatric:        Attention and Perception: He is inattentive.        Mood and Affect: Mood is anxious and depressed. Affect is tearful.        Speech: Speech is delayed.        Behavior: Behavior is slowed and withdrawn. Behavior is cooperative.        Thought Content: Thought content is not paranoid or delusional. Thought content does not include homicidal or suicidal ideation.        Cognition and Memory: Cognition and memory normal.        Judgment: Judgment normal.        Assessment/Plan: 1. Chronic left shoulder pain (Primary) Declined referral to orthopedic for now. Continue prn OTC ibuprofen  or tylenol.   2. Tremor of both hands Noted. He reports he may have a family history of parkinson's disease but he is not sure. Declined neurology referral for now but he will call the clinic if the tremors worsen.   3. Mixed hyperlipidemia Routine labs ordered  - CMP14+EGFR - CBC with  Differential/Platelet - Lipid Profile - Vitamin D  (25 hydroxy)  4. Hyperbilirubinemia  of non-newborn patient Routine labs ordered  - CMP14+EGFR - CBC with Differential/Platelet - Lipid Profile - Vitamin D  (25 hydroxy)  5. Numerous skin moles Referred to dermatology at [facility] with Dr. Lauraine Kanaris.  - Ambulatory referral to Dermatology  6. Vitamin D  deficiency Routine labs ordered  - CMP14+EGFR - CBC with Differential/Platelet - Lipid Profile - Vitamin D  (25 hydroxy)  7. Problems in relationship with spouse or partner Referred to therapy - Ambulatory referral to Psychology  8. ADHD (attention deficit hyperactivity disorder), combined type Continue adderall as prescribed. Follow up in 3 months for additional refills. Due for UDS at next visit.  - amphetamine -dextroamphetamine  (ADDERALL) 10 MG tablet; Take 1 tablet (10 mg total) by mouth daily at 12 noon.  Dispense: 30 tablet; Refill: 0 - amphetamine -dextroamphetamine  (ADDERALL) 10 MG tablet; Take 1 tablet (10 mg total) by mouth daily at 12 noon.  Dispense: 30 tablet; Refill: 0 - amphetamine -dextroamphetamine  (ADDERALL XR) 20 MG 24 hr capsule; Take 1 capsule (20 mg total) by mouth daily.  Dispense: 30 capsule; Refill: 0 - amphetamine -dextroamphetamine  (ADDERALL XR) 20 MG 24 hr capsule; Take 1 capsule (20 mg total) by mouth daily.  Dispense: 30 capsule; Refill: 0 - amphetamine -dextroamphetamine  (ADDERALL) 10 MG tablet; Take 1 tablet (10 mg total) by mouth daily at 12 noon.  Dispense: 30 tablet; Refill: 0 - amphetamine -dextroamphetamine  (ADDERALL XR) 20 MG 24 hr capsule; Take 1 capsule (20 mg total) by mouth daily.  Dispense: 30 capsule; Refill: 0  9. GAD (generalized anxiety disorder) Referred to therapy and started on lexapro  10 mg daily  - Ambulatory referral to Psychology - escitalopram  (LEXAPRO ) 10 MG tablet; Take 1 tablet (10 mg total) by mouth daily.  Dispense: 30 tablet; Refill: 3  10. Current moderate episode  of major depressive disorder without prior episode (HCC) Referred to therapy and started on lexapro  10 mg daily  - Ambulatory referral to Psychology - escitalopram  (LEXAPRO ) 10 MG tablet; Take 1 tablet (10 mg total) by mouth daily.  Dispense: 30 tablet; Refill: 3   General Counseling: karma hiney understanding of the findings of todays visit and agrees with plan of treatment. I have discussed any further diagnostic evaluation that may be needed or ordered today. We also reviewed his medications today. he has been encouraged to call the office with any questions or concerns that should arise related to todays visit.    Orders Placed This Encounter  Procedures   CMP14+EGFR   CBC with Differential/Platelet   Lipid Profile   Vitamin D  (25 hydroxy)   Ambulatory referral to Psychology   Ambulatory referral to Dermatology    Meds ordered this encounter  Medications   amphetamine -dextroamphetamine  (ADDERALL) 10 MG tablet    Sig: Take 1 tablet (10 mg total) by mouth daily at 12 noon.    Dispense:  30 tablet    Refill:  0    This dose is in additional to the adderal XR dose. Fill for 10/31   amphetamine -dextroamphetamine  (ADDERALL) 10 MG tablet    Sig: Take 1 tablet (10 mg total) by mouth daily at 12 noon.    Dispense:  30 tablet    Refill:  0    This dose is in additional to the adderal XR dose. Fill for 12/26   amphetamine -dextroamphetamine  (ADDERALL XR) 20 MG 24 hr capsule    Sig: Take 1 capsule (20 mg total) by mouth daily.    Dispense:  30 capsule    Refill:  0    Fill  for 12/26   amphetamine -dextroamphetamine  (ADDERALL XR) 20 MG 24 hr capsule    Sig: Take 1 capsule (20 mg total) by mouth daily.    Dispense:  30 capsule    Refill:  0    Fill for 11/28   amphetamine -dextroamphetamine  (ADDERALL) 10 MG tablet    Sig: Take 1 tablet (10 mg total) by mouth daily at 12 noon.    Dispense:  30 tablet    Refill:  0    This dose is in additional to the adderal XR dose. Fill for  11/28   amphetamine -dextroamphetamine  (ADDERALL XR) 20 MG 24 hr capsule    Sig: Take 1 capsule (20 mg total) by mouth daily.    Dispense:  30 capsule    Refill:  0    Fill for 10/31   escitalopram  (LEXAPRO ) 10 MG tablet    Sig: Take 1 tablet (10 mg total) by mouth daily.    Dispense:  30 tablet    Refill:  3    Fill new script today    Return in about 3 months (around 02/20/2024) for F/U, ADHD med check, Derra Shartzer PCP.   Total time spent:30 Minutes Time spent includes review of chart, medications, test results, and follow up plan with the patient.   [place] Controlled Substance Database was reviewed by me.  This patient was seen by Mardy Maxin, FNP-C in collaboration with Dr. Sigrid Bathe as a part of collaborative care agreement.   Queenie Aufiero R. Maxin, MSN, FNP-C Internal medicine

## 2023-12-03 ENCOUNTER — Telehealth: Payer: Self-pay | Admitting: Nurse Practitioner

## 2023-12-03 NOTE — Telephone Encounter (Signed)
 Awaiting 11/30/23 office notes for Psychology referral-Toni

## 2023-12-06 ENCOUNTER — Encounter: Payer: Self-pay | Admitting: Nurse Practitioner

## 2023-12-06 ENCOUNTER — Telehealth: Payer: Self-pay | Admitting: Nurse Practitioner

## 2023-12-06 NOTE — Telephone Encounter (Signed)
 Psychology referral faxed to [facility]; [phone] Notified patient. Gave pt telephone [phone]

## 2023-12-13 DIAGNOSIS — F331 Major depressive disorder, recurrent, moderate: Secondary | ICD-10-CM | POA: Diagnosis not present

## 2023-12-16 IMAGING — DX DG SHOULDER 2+V*L*
3 series · 3 of 3 positions shown · non-contrast
Comparison: None.

CLINICAL DATA: Left shoulder pain

EXAM:
LEFT SHOULDER - 2+ VIEW

[shoulder ap]
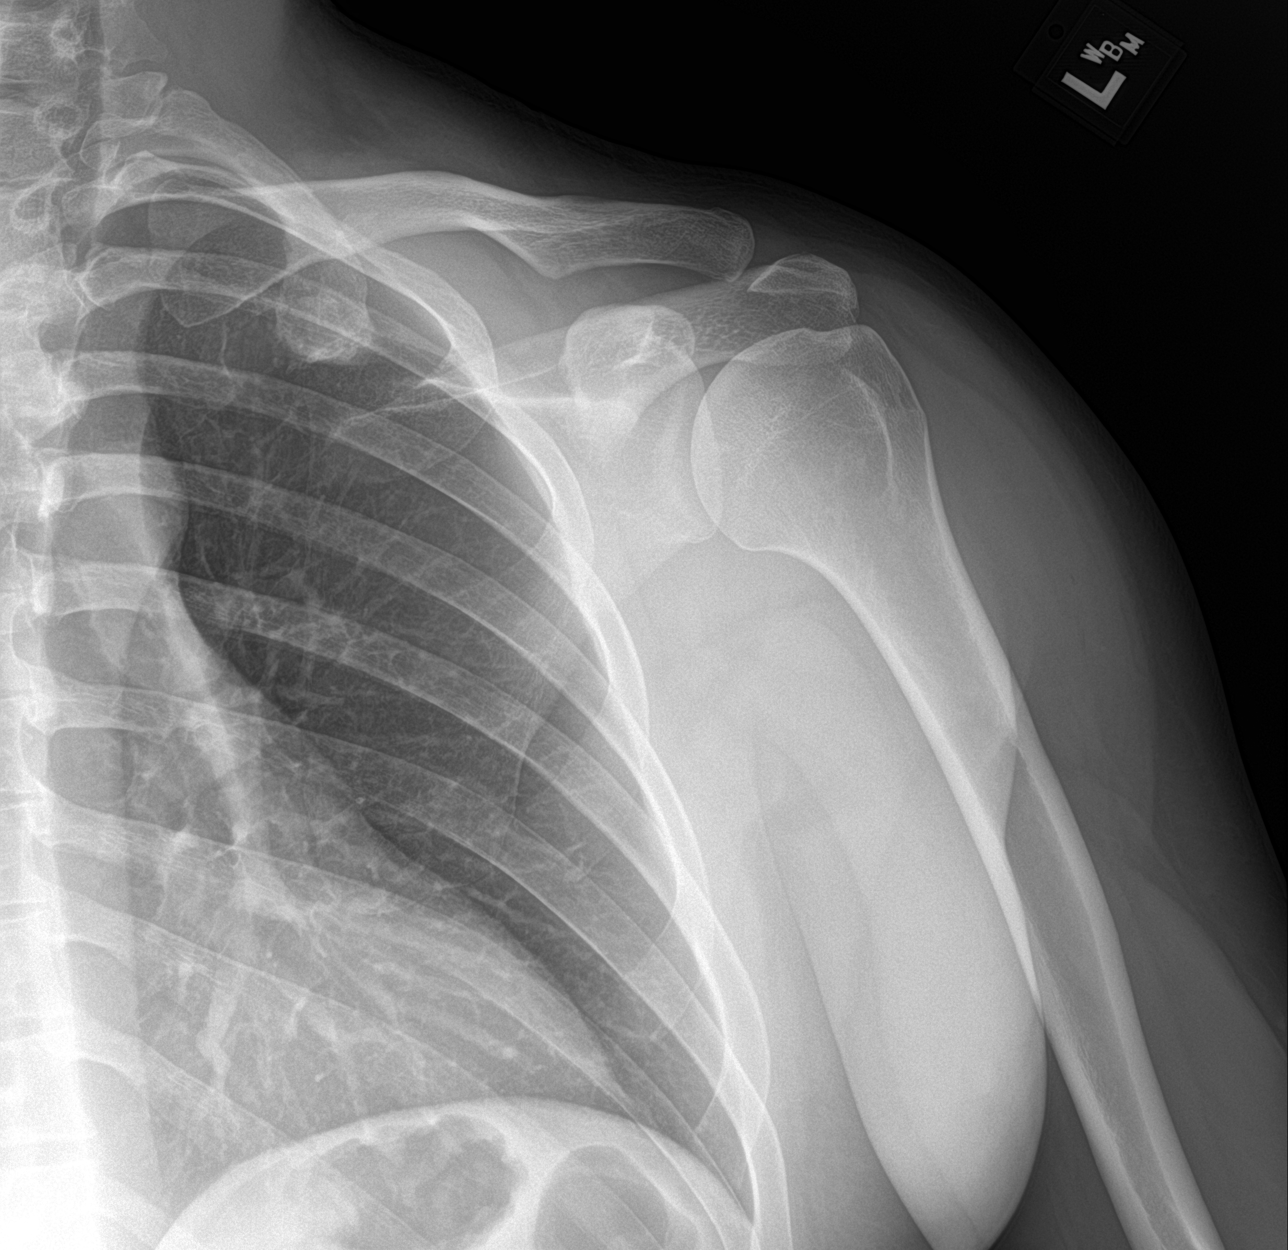

[shoulder y-view]
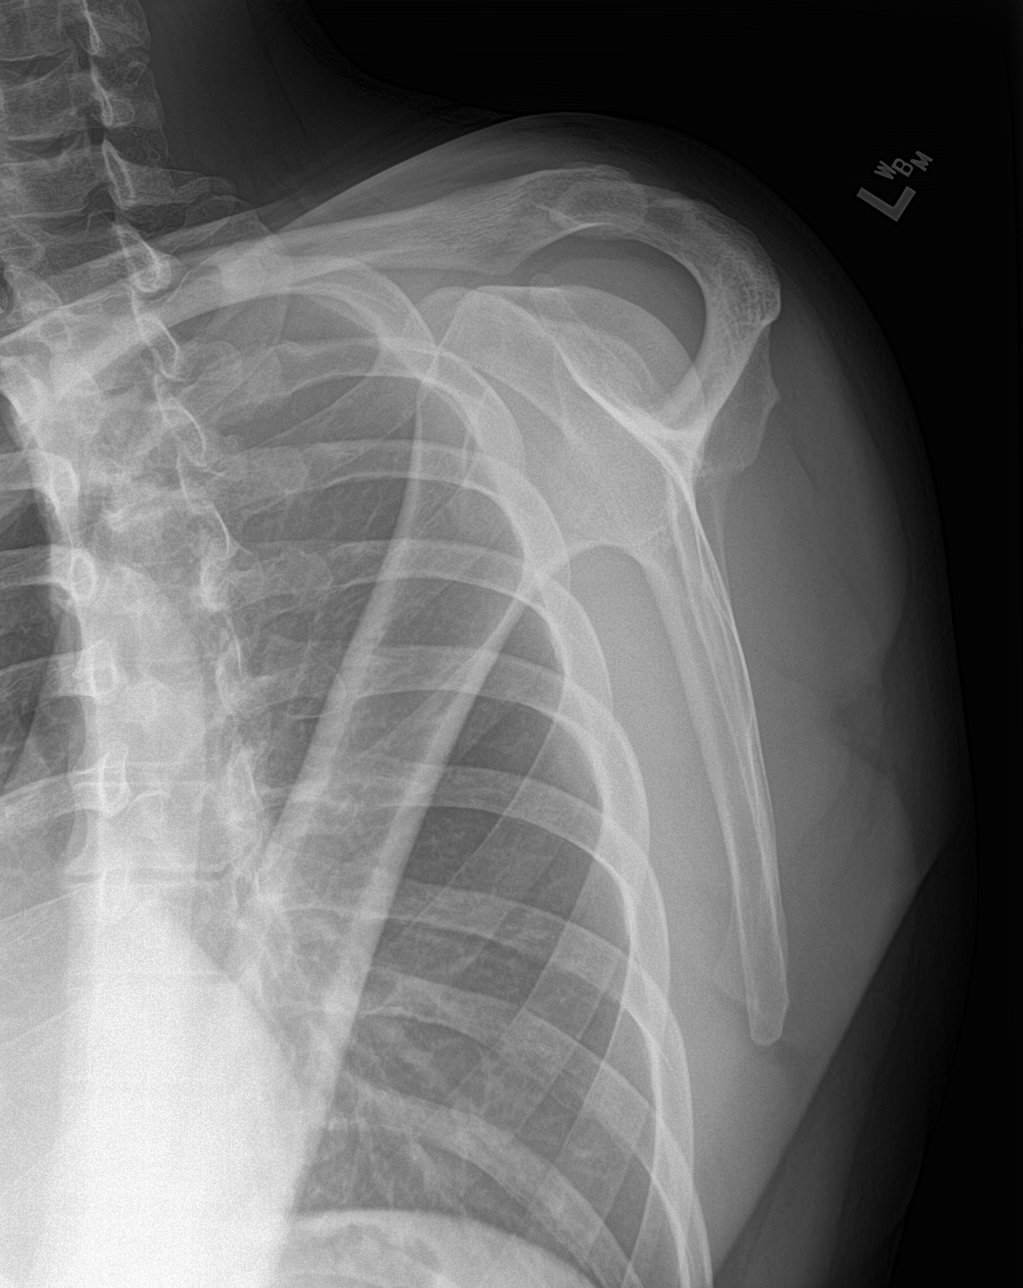

[shoulder axial]
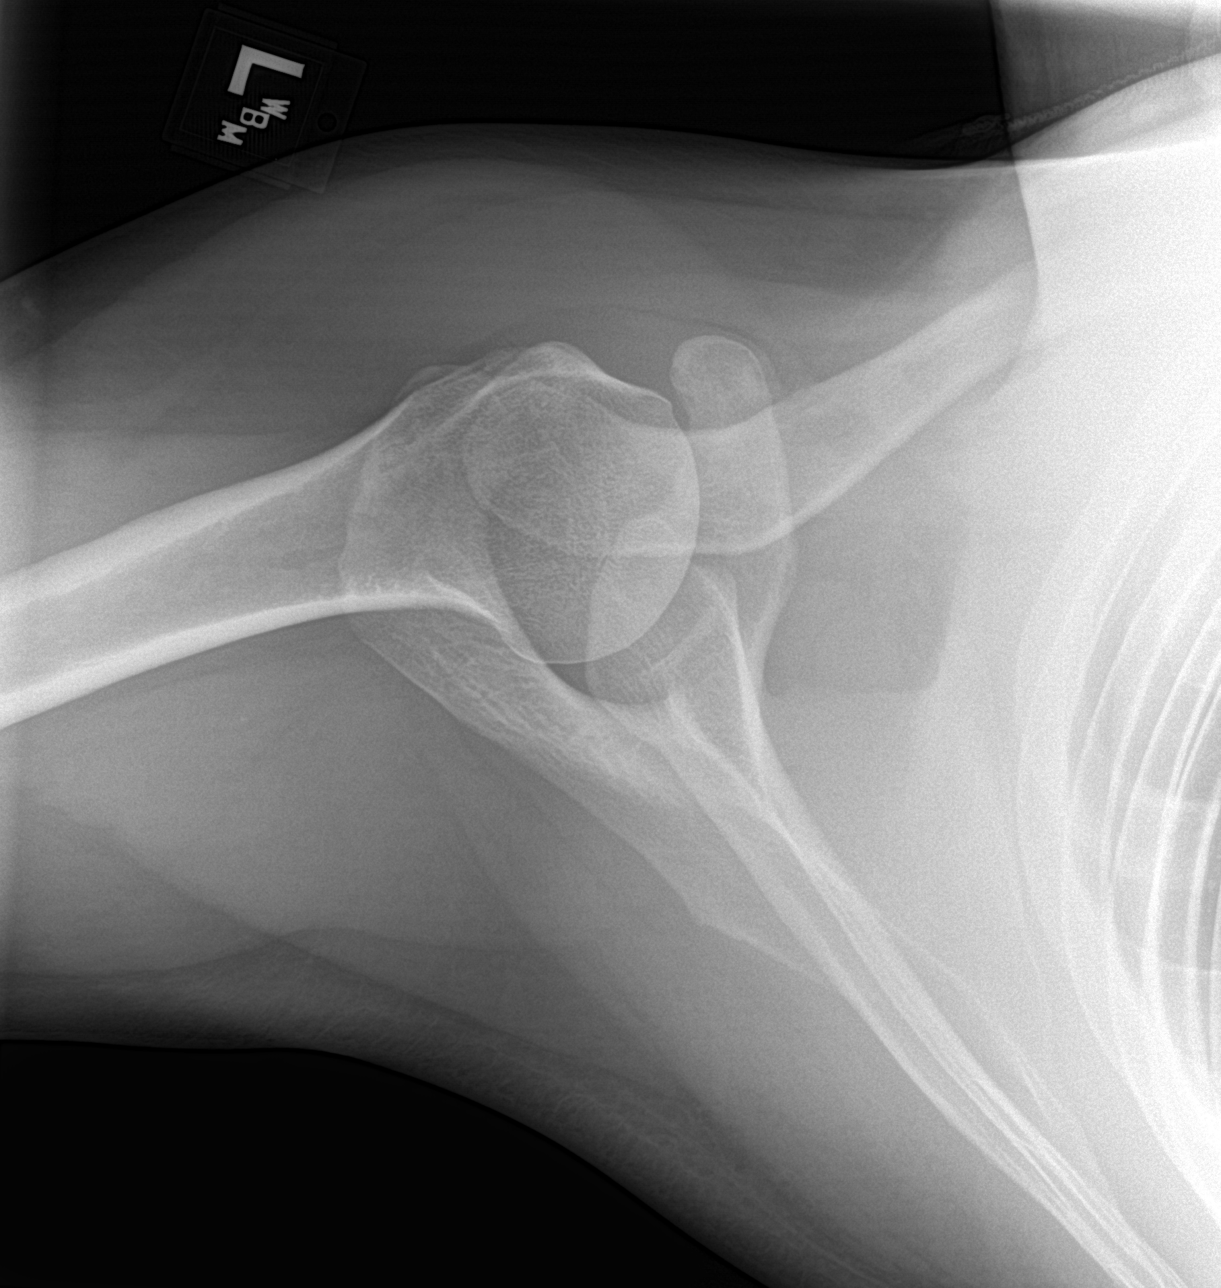

[3 of 3 positions shown; findings below may reference images not displayed]

FINDINGS: Mild inferior acromioclavicular joint space narrowing. The
glenohumeral joint space is maintained. No acute fracture or
dislocation. The visualized portion of the left lung is
unremarkable.
IMPRESSION: Minimal acromioclavicular degenerative change.

## 2023-12-16 IMAGING — DX DG CERVICAL SPINE COMPLETE 4+V
6 series · 6 of 6 positions shown · non-contrast
Comparison: None.

CLINICAL DATA: Neck pain.

EXAM:
CERVICAL SPINE - COMPLETE 4+ VIEW

[c-spine lat]
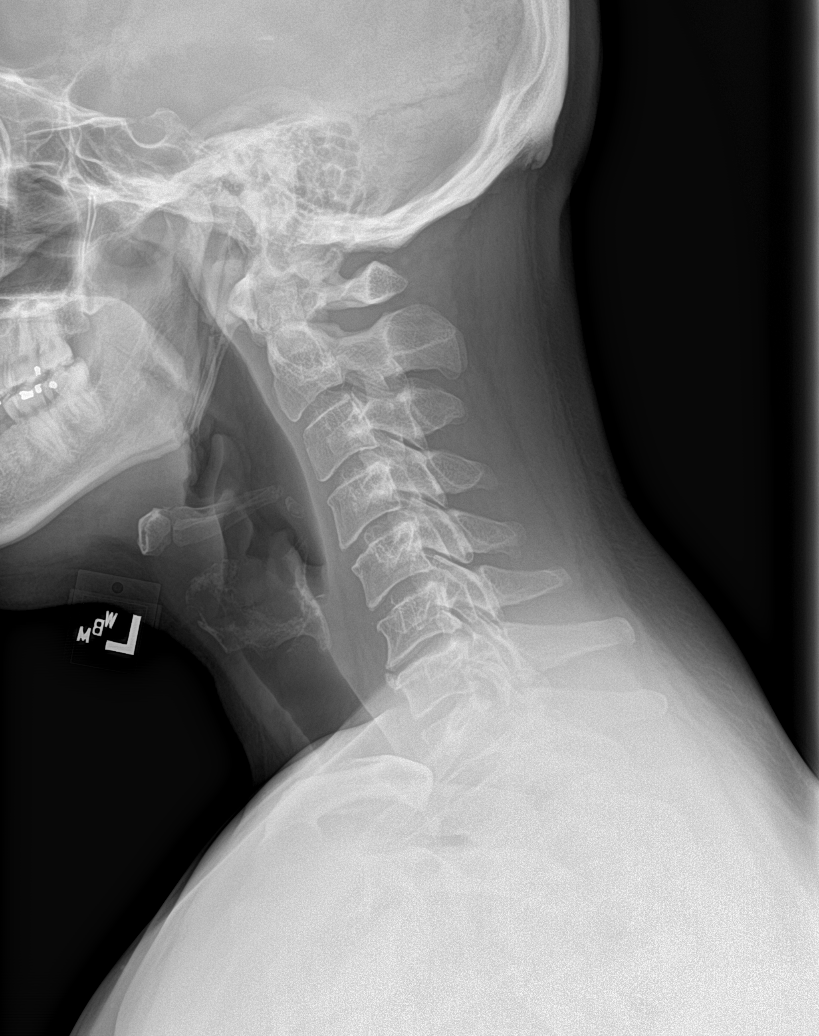

[c-spine obl (1 of 3)]
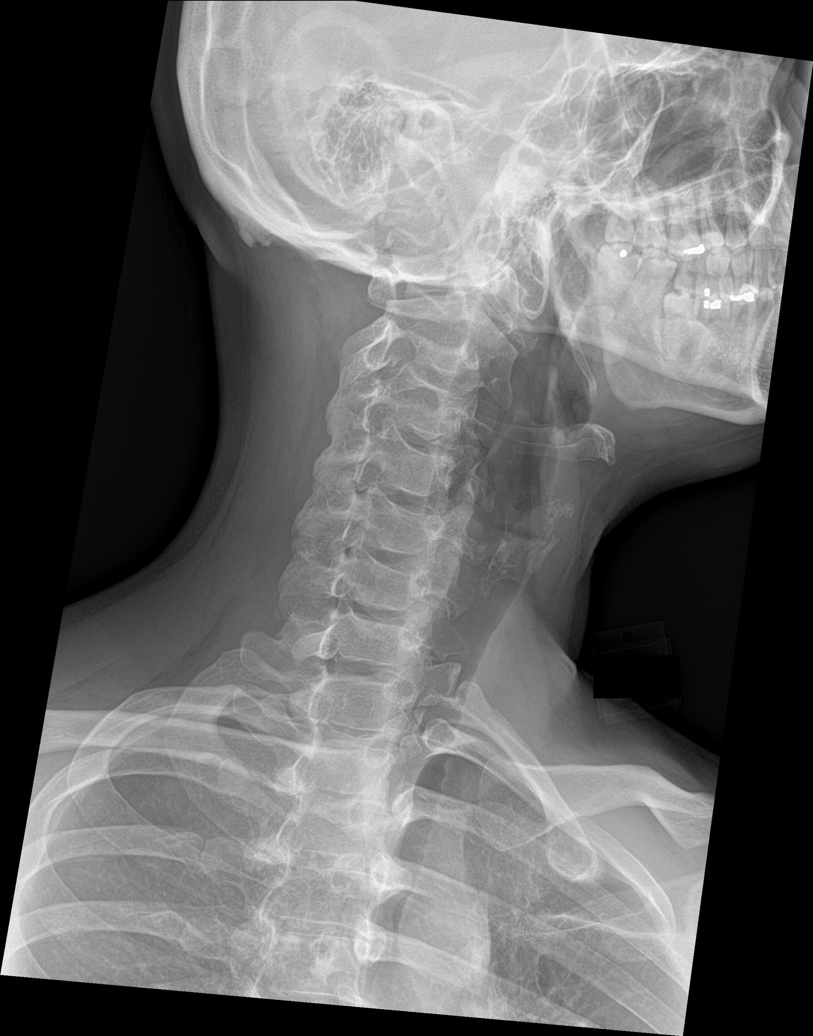

[c-spine obl (2 of 3)]
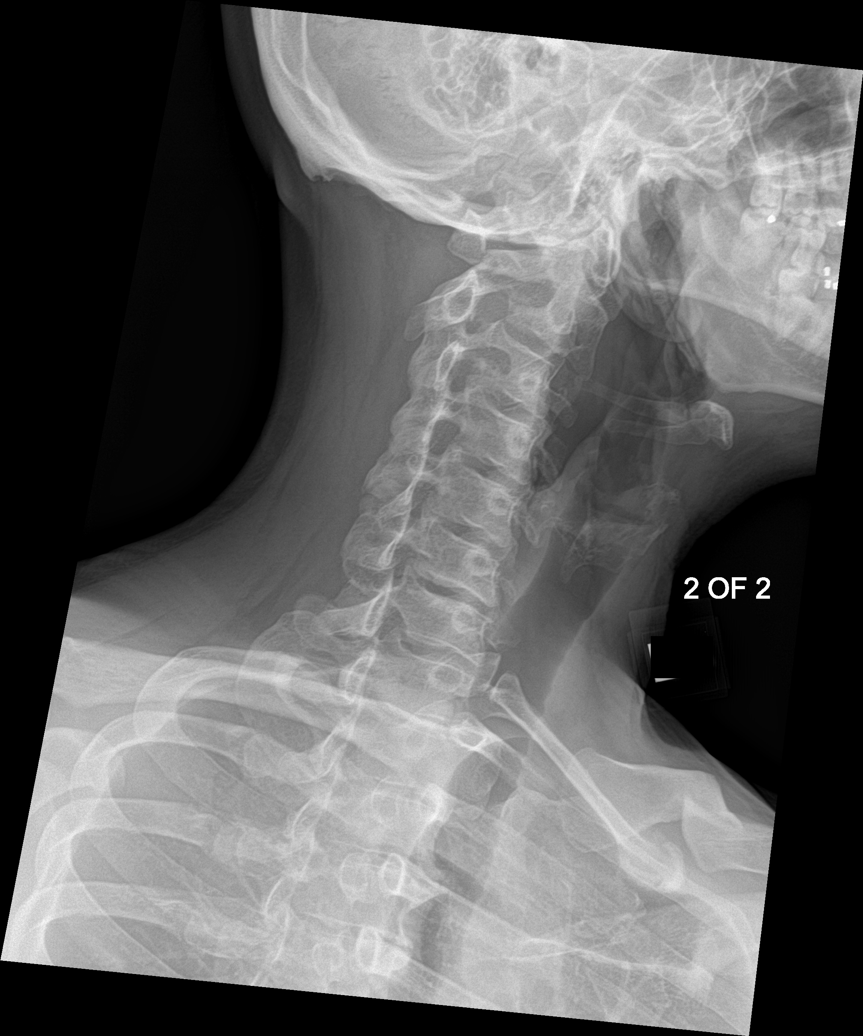

[c-spine ap]
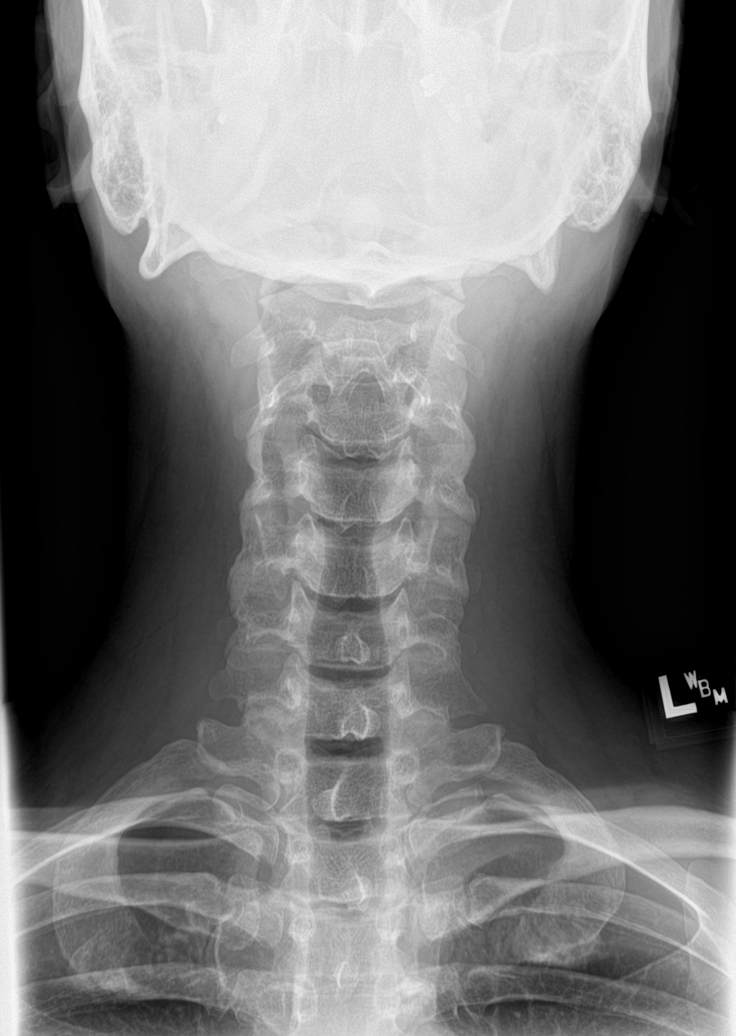

[c-spine open mouth]
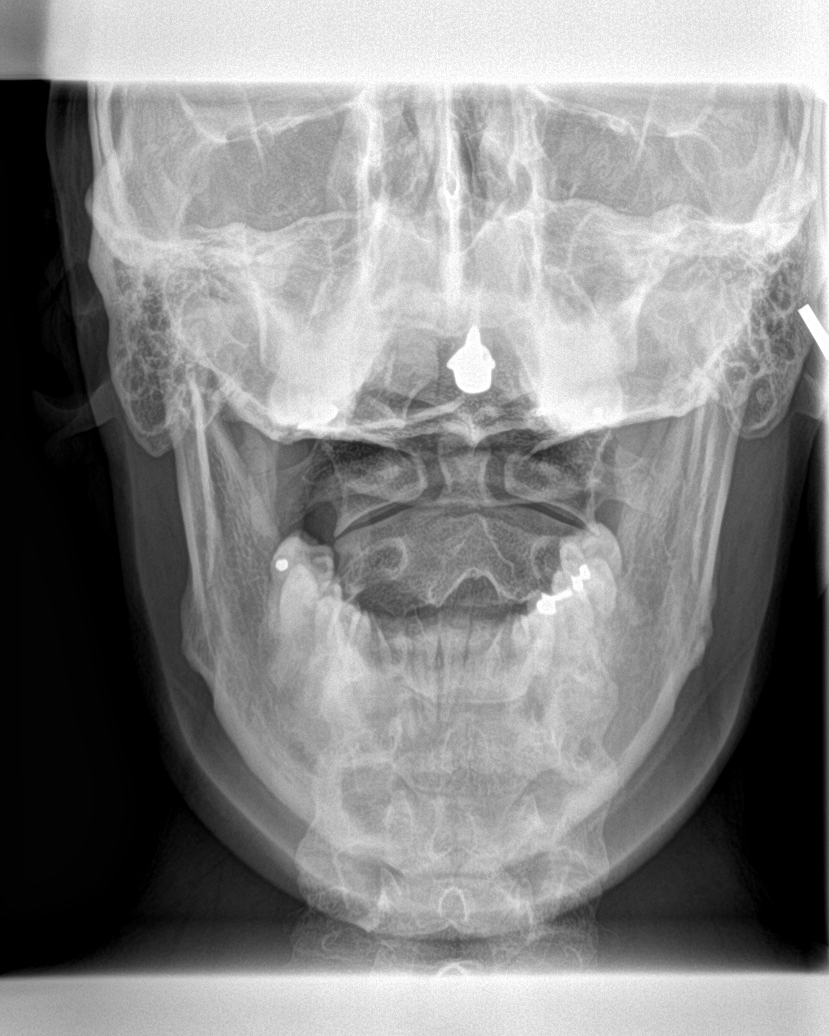

[c-spine obl (3 of 3)]
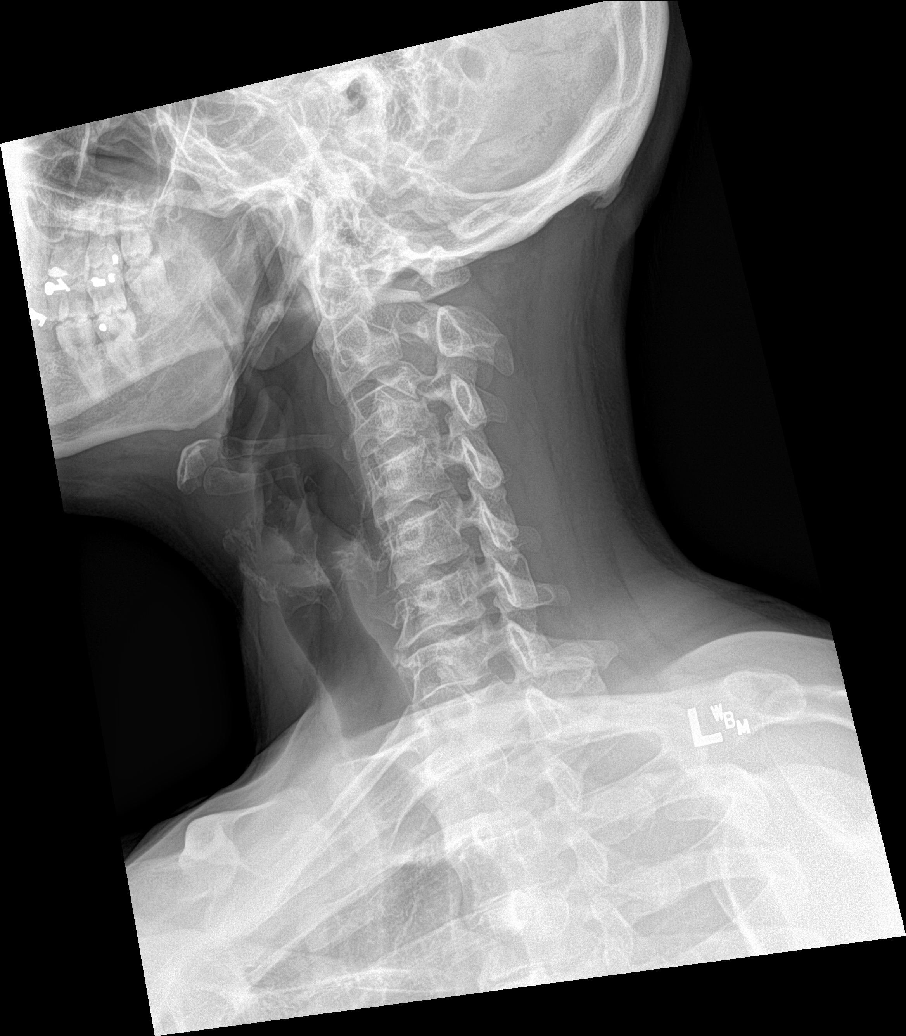

[6 of 6 positions shown; findings below may reference images not displayed]

FINDINGS: There is straightening of the normal cervical lordosis. Normal
frontal alignment. No sagittal spondylolisthesis. Vertebral body
heights are maintained. Mild anterior greater than posterior C6-7
disc space narrowing. Small anterior C6-7 disc ossicle.

The atlantodens interval is intact. Mild-to-moderate posterior C6-7
endplate osteophytosis.

There is likely mild bilateral C5-6 and C6-7 and mild left C3-4
neural foraminal narrowing on oblique views.

The lateral masses of C1 are symmetrically aligned with the dens on
open mouth odontoid view.

No prevertebral soft tissue swelling.  The lung apices are clear.
IMPRESSION: :
IMPRESSION: 1. Mild C6-7 disc and endplate degenerative changes.
2. Likely mild bilateral C5-6 and C6-7 mild left C3-4 neural
foraminal narrowing.

## 2023-12-21 DIAGNOSIS — F331 Major depressive disorder, recurrent, moderate: Secondary | ICD-10-CM | POA: Diagnosis not present

## 2023-12-24 ENCOUNTER — Ambulatory Visit

## 2023-12-24 DIAGNOSIS — L814 Other melanin hyperpigmentation: Secondary | ICD-10-CM | POA: Diagnosis not present

## 2023-12-24 DIAGNOSIS — D1801 Hemangioma of skin and subcutaneous tissue: Secondary | ICD-10-CM

## 2023-12-24 DIAGNOSIS — Z1283 Encounter for screening for malignant neoplasm of skin: Secondary | ICD-10-CM | POA: Diagnosis not present

## 2023-12-24 DIAGNOSIS — L821 Other seborrheic keratosis: Secondary | ICD-10-CM | POA: Diagnosis not present

## 2023-12-24 DIAGNOSIS — D492 Neoplasm of unspecified behavior of bone, soft tissue, and skin: Secondary | ICD-10-CM

## 2023-12-24 DIAGNOSIS — W908XXA Exposure to other nonionizing radiation, initial encounter: Secondary | ICD-10-CM

## 2023-12-24 DIAGNOSIS — L578 Other skin changes due to chronic exposure to nonionizing radiation: Secondary | ICD-10-CM

## 2023-12-24 DIAGNOSIS — L7211 Pilar cyst: Secondary | ICD-10-CM

## 2023-12-24 DIAGNOSIS — D229 Melanocytic nevi, unspecified: Secondary | ICD-10-CM

## 2023-12-24 DIAGNOSIS — L739 Follicular disorder, unspecified: Secondary | ICD-10-CM

## 2023-12-24 MED ORDER — CLINDAMYCIN PHOSPHATE 1 % EX LOTN
TOPICAL_LOTION | CUTANEOUS | 0 refills | Status: AC
Start: 2023-12-24 — End: ?

## 2023-12-24 NOTE — Progress Notes (Signed)
 Subjective   Ochoa Ochoa is a 38 y.o. male who presents for the following: Total body skin exam for skin cancer screening and mole check. The patient has spots, moles and lesions to be evaluated, some may be new or changing and the patient may have concern these could be cancer.. Patient is new patient   Review of Systems:    No other skin or systemic complaints except as noted in HPI or Assessment and Plan.  The following portions of the chart were reviewed this encounter and updated as appropriate: medications, allergies, medical history  Relevant Medical History:  Reviewed   Objective  (SKPE) Well appearing patient in no apparent distress; mood and affect are within normal limits. Examination was performed of the: Full Skin Examination: scalp, head, eyes, ears, nose, lips, neck, chest, axillae, abdomen, back, buttocks, bilateral upper extremities, bilateral lower extremities, hands, feet, fingers, toes, fingernails, and toenails.   Examination notable for: SKIN EXAM, Angioma(s): Scattered red vascular papule(s)  , Lentigo/lentigines: Scattered pigmented macules that are tan to brown in color and are somewhat non-uniform in shape and concentrated in the sun-exposed areas, Nevus/nevi: Scattered well-demarcated, regular, pigmented macule(s) and/or papule(s)  , Seborrheic Keratosis(es): Stuck-on appearing keratotic papule(s) on the trunk, some  irritated with redness, crusting, edema, and/or partial avulsion, Actinic Damage/Elastosis: chronic sun damage: dyspigmentation, telangiectasia, and wrinkling, Actinic keratosis: Scaly erythematous macule(s) concentrated on sun exposed areas   Examination limited by: Undergarments   L mid back 1.0 cm firm nodule on L mid back  Assessment & Plan  (SKAP)   SKIN CANCER SCREENING PERFORMED TODAY.  BENIGN SKIN FINDINGS  - Lentigines  - Seborrheic keratoses  - Hemangiomas   - Nevus/Multiple Benign Nevi - Reassurance provided regarding the benign  appearance of lesions noted on exam today; no treatment is indicated in the absence of symptoms/changes. - Reinforced importance of photoprotective strategies including liberal and frequent sunscreen use of a broad-spectrum SPF 30 or greater, use of protective clothing, and sun avoidance for prevention of cutaneous malignancy and photoaging.  Counseled patient on the importance of regular self-skin monitoring as well as routine clinical skin examinations as scheduled.   ACTINIC DAMAGE - Chronic condition, secondary to cumulative UV/sun exposure - Recommend daily broad spectrum sunscreen SPF 30+ to sun-exposed areas, reapply every 2 hours as needed.  - Staying in the shade or wearing long sleeves, sun glasses (UVA+UVB protection) and wide brim hats (4-inch brim around the entire circumference of the hat) are also recommended for sun protection.  - Call for new or changing lesions.  Folliculitis/Inflamed papule R inguinal crease  - Start Clindamycin  lotion apply to affected skin qd-bid  - discussed bx if not resolved on above   Multiple firm nodules - favor leiomyomas  - lesion on L mid back excised as per below  - denies any family members w/ similar sx  - recommended workup for Reed syndrome if positive for leiomyoma     Level of service outlined above   Patient instructions (SKPI)   Procedures, orders, diagnosis for this visit:  NEOPLASM OF SKIN L mid back Skin repair Complexity:  Simple Fine/surface layer approximation (top stitches):  Suture size:  4-0 Suture type comment:  Nylon Stitches: simple interrupted   Outcome: patient tolerated procedure well with no complications   Post-procedure details: wound care instructions given   Post-procedure details comment:  Ointment and pressure bandage applied  Skin excision  Excision method:  punch Lesion length (cm):  1 Total excision  diameter (cm):  1 Informed consent: discussed and consent obtained   Timeout: patient name, date  of birth, surgical site, and procedure verified   Anesthesia: the lesion was anesthetized in a standard fashion   Anesthetic:  1% lidocaine  w/ epinephrine 1-100,000 local infiltration Hemostasis achieved with: suture and pressure   Outcome: patient tolerated procedure well with no complications   Post-procedure details: sterile dressing applied    Specimen 1 - Surgical pathology Differential Diagnosis: R/O   Check Margins: No  Neoplasm of skin -     Surgical pathology; Standing -     Skin repair -     Skin excision  Other orders -     Clindamycin  Phosphate; Apply to affected skin qd-bid  Dispense: 60 mL; Refill: 0    Return to clinic: Return in about 1 week (around 12/31/2023) for suture removal.  I, Fay Kirks, CMA, am acting as scribe for Lauraine JAYSON Kanaris, MD .   Documentation: I have reviewed the above documentation for accuracy and completeness, and I agree with the above.  Lauraine JAYSON Kanaris, MD

## 2023-12-24 NOTE — Patient Instructions (Addendum)

## 2023-12-25 ENCOUNTER — Ambulatory Visit: Payer: Self-pay

## 2023-12-25 LAB — SURGICAL PATHOLOGY

## 2023-12-25 NOTE — Telephone Encounter (Signed)
-----   Message from Lauraine JAYSON Kanaris sent at 12/25/2023  4:25 PM EST -----       1. Skin, left mid back :       PILAR CYST  Please notify patient with below plan: Benign, observe.   ----- Message ----- From: Interface, Lab In [facility] Sent: 12/25/2023   3:19 PM EST To: Lauraine JAYSON Kanaris, MD

## 2023-12-25 NOTE — Telephone Encounter (Signed)
 Patient informed of pathology results

## 2024-01-01 ENCOUNTER — Encounter

## 2024-01-03 DIAGNOSIS — F331 Major depressive disorder, recurrent, moderate: Secondary | ICD-10-CM | POA: Diagnosis not present

## 2024-01-11 DIAGNOSIS — F331 Major depressive disorder, recurrent, moderate: Secondary | ICD-10-CM | POA: Diagnosis not present

## 2024-02-22 ENCOUNTER — Ambulatory Visit: Admitting: Nurse Practitioner

## 2024-06-02 ENCOUNTER — Encounter: Admitting: Nurse Practitioner

## 2024-09-09 ENCOUNTER — Ambulatory Visit: Admitting: Physician Assistant
